# Patient Record
Sex: Male | Born: 1955 | Race: White | Hispanic: No | Marital: Single | Smoking: Former smoker
Health system: Southern US, Community
[De-identification: ages and names within clinical notes are randomized; demographics above are authoritative.]

## PROBLEM LIST (undated history)

## (undated) DIAGNOSIS — E079 Disorder of thyroid, unspecified: Secondary | ICD-10-CM

## (undated) DIAGNOSIS — I10 Essential (primary) hypertension: Secondary | ICD-10-CM

## (undated) DIAGNOSIS — J45909 Unspecified asthma, uncomplicated: Secondary | ICD-10-CM

## (undated) DIAGNOSIS — I219 Acute myocardial infarction, unspecified: Secondary | ICD-10-CM

## (undated) DIAGNOSIS — Z951 Presence of aortocoronary bypass graft: Secondary | ICD-10-CM

## (undated) HISTORY — PX: CORONARY ARTERY BYPASS GRAFT: SHX141

## (undated) HISTORY — PX: HERNIA REPAIR: SHX51

## (undated) HISTORY — DX: Presence of aortocoronary bypass graft: Z95.1

## (undated) HISTORY — PX: KNEE SURGERY: SHX244

## (undated) HISTORY — DX: Acute myocardial infarction, unspecified: I21.9

## (undated) HISTORY — DX: Unspecified asthma, uncomplicated: J45.909

## (undated) HISTORY — PX: GASTRIC BYPASS: SHX52

## (undated) HISTORY — DX: Essential (primary) hypertension: I10

## (undated) HISTORY — PX: CARDIAC SURGERY: SHX584

## (undated) HISTORY — PX: REPLACEMENT TOTAL KNEE BILATERAL: SUR1225

---

## 2015-04-01 ENCOUNTER — Observation Stay: Payer: PRIVATE HEALTH INSURANCE

## 2015-04-01 ENCOUNTER — Emergency Department: Payer: PRIVATE HEALTH INSURANCE

## 2015-04-01 ENCOUNTER — Observation Stay
Admission: EM | Admit: 2015-04-01 | Discharge: 2015-04-02 | Disposition: A | Discharge status: Home / Self Care | Payer: PRIVATE HEALTH INSURANCE | Attending: Internal Medicine | Admitting: Internal Medicine

## 2015-04-01 ENCOUNTER — Observation Stay: Payer: PRIVATE HEALTH INSURANCE | Admitting: Internal Medicine

## 2015-04-01 DIAGNOSIS — I25119 Atherosclerotic heart disease of native coronary artery with unspecified angina pectoris: Secondary | ICD-10-CM | POA: Insufficient documentation

## 2015-04-01 DIAGNOSIS — Z87891 Personal history of nicotine dependence: Secondary | ICD-10-CM | POA: Insufficient documentation

## 2015-04-01 DIAGNOSIS — R799 Abnormal finding of blood chemistry, unspecified: Secondary | ICD-10-CM | POA: Insufficient documentation

## 2015-04-01 DIAGNOSIS — R42 Dizziness and giddiness: Secondary | ICD-10-CM | POA: Insufficient documentation

## 2015-04-01 DIAGNOSIS — Z7982 Long term (current) use of aspirin: Secondary | ICD-10-CM | POA: Insufficient documentation

## 2015-04-01 DIAGNOSIS — R1013 Epigastric pain: Secondary | ICD-10-CM | POA: Insufficient documentation

## 2015-04-01 DIAGNOSIS — R600 Localized edema: Secondary | ICD-10-CM | POA: Insufficient documentation

## 2015-04-01 DIAGNOSIS — J45909 Unspecified asthma, uncomplicated: Secondary | ICD-10-CM | POA: Insufficient documentation

## 2015-04-01 DIAGNOSIS — R0789 Other chest pain: Principal | ICD-10-CM | POA: Insufficient documentation

## 2015-04-01 DIAGNOSIS — R918 Other nonspecific abnormal finding of lung field: Secondary | ICD-10-CM | POA: Insufficient documentation

## 2015-04-01 DIAGNOSIS — N400 Enlarged prostate without lower urinary tract symptoms: Secondary | ICD-10-CM | POA: Insufficient documentation

## 2015-04-01 DIAGNOSIS — R001 Bradycardia, unspecified: Secondary | ICD-10-CM | POA: Insufficient documentation

## 2015-04-01 DIAGNOSIS — E785 Hyperlipidemia, unspecified: Secondary | ICD-10-CM | POA: Insufficient documentation

## 2015-04-01 DIAGNOSIS — K219 Gastro-esophageal reflux disease without esophagitis: Secondary | ICD-10-CM | POA: Insufficient documentation

## 2015-04-01 DIAGNOSIS — R5381 Other malaise: Secondary | ICD-10-CM | POA: Insufficient documentation

## 2015-04-01 DIAGNOSIS — Z9884 Bariatric surgery status: Secondary | ICD-10-CM | POA: Insufficient documentation

## 2015-04-01 DIAGNOSIS — I10 Essential (primary) hypertension: Secondary | ICD-10-CM | POA: Insufficient documentation

## 2015-04-01 DIAGNOSIS — I252 Old myocardial infarction: Secondary | ICD-10-CM | POA: Insufficient documentation

## 2015-04-01 DIAGNOSIS — D649 Anemia, unspecified: Secondary | ICD-10-CM | POA: Insufficient documentation

## 2015-04-01 DIAGNOSIS — Z6841 Body Mass Index (BMI) 40.0 and over, adult: Secondary | ICD-10-CM | POA: Insufficient documentation

## 2015-04-01 DIAGNOSIS — R079 Chest pain, unspecified: Secondary | ICD-10-CM | POA: Insufficient documentation

## 2015-04-01 DIAGNOSIS — Z8249 Family history of ischemic heart disease and other diseases of the circulatory system: Secondary | ICD-10-CM | POA: Insufficient documentation

## 2015-04-01 DIAGNOSIS — I083 Combined rheumatic disorders of mitral, aortic and tricuspid valves: Secondary | ICD-10-CM | POA: Insufficient documentation

## 2015-04-01 DIAGNOSIS — Z951 Presence of aortocoronary bypass graft: Secondary | ICD-10-CM

## 2015-04-01 DIAGNOSIS — I493 Ventricular premature depolarization: Secondary | ICD-10-CM | POA: Insufficient documentation

## 2015-04-01 DIAGNOSIS — I371 Nonrheumatic pulmonary valve insufficiency: Secondary | ICD-10-CM | POA: Insufficient documentation

## 2015-04-01 DIAGNOSIS — I517 Cardiomegaly: Secondary | ICD-10-CM | POA: Insufficient documentation

## 2015-04-01 LAB — CBC AND DIFFERENTIAL
Basophils Absolute Automated: 0.08 10*3/uL (ref 0.00–0.20)
Basophils Automated: 1 %
Eosinophils Absolute Automated: 0.16 10*3/uL (ref 0.00–0.70)
Eosinophils Automated: 2 %
Hematocrit: 34.3 % — ABNORMAL LOW (ref 42.0–52.0)
Hgb: 10.6 g/dL — ABNORMAL LOW (ref 13.0–17.0)
Immature Granulocytes Absolute: 0.01 10*3/uL
Immature Granulocytes: 0 %
Lymphocytes Absolute Automated: 1.38 10*3/uL (ref 0.50–4.40)
Lymphocytes Automated: 18 %
MCH: 25.4 pg — ABNORMAL LOW (ref 28.0–32.0)
MCHC: 30.9 g/dL — ABNORMAL LOW (ref 32.0–36.0)
MCV: 82.1 fL (ref 80.0–100.0)
MPV: 10.6 fL (ref 9.4–12.3)
Monocytes Absolute Automated: 0.66 10*3/uL (ref 0.00–1.20)
Monocytes: 8 %
Neutrophils Absolute: 5.45 10*3/uL (ref 1.80–8.10)
Neutrophils: 70 %
Nucleated RBC: 0 /100 WBC (ref 0–1)
Platelets: 308 10*3/uL (ref 140–400)
RBC: 4.18 10*6/uL — ABNORMAL LOW (ref 4.70–6.00)
RDW: 18 % — ABNORMAL HIGH (ref 12–15)
WBC: 7.74 10*3/uL (ref 3.50–10.80)

## 2015-04-01 LAB — BASIC METABOLIC PANEL
BUN: 23 mg/dL (ref 9.0–28.0)
CO2: 21 mEq/L — ABNORMAL LOW (ref 22–29)
Calcium: 9.2 mg/dL (ref 8.5–10.5)
Chloride: 106 mEq/L (ref 100–111)
Creatinine: 1.5 mg/dL — ABNORMAL HIGH (ref 0.7–1.3)
Glucose: 72 mg/dL (ref 70–100)
Potassium: 4.5 mEq/L (ref 3.5–5.1)
Sodium: 140 mEq/L (ref 136–145)

## 2015-04-01 LAB — I-STAT TROPONIN: i-STAT Troponin: 0 ng/mL (ref 0.00–0.09)

## 2015-04-01 LAB — B-TYPE NATRIURETIC PEPTIDE: B-Natriuretic Peptide: 265 pg/mL — ABNORMAL HIGH (ref 0–100)

## 2015-04-01 LAB — GFR: EGFR: 47.6

## 2015-04-01 MED ORDER — MORPHINE SULFATE 2 MG/ML IJ/IV SOLN (WRAP)
2.0000 mg | Status: DC | PRN
Start: 2015-04-01 — End: 2015-04-03

## 2015-04-01 MED ORDER — BENZONATATE 100 MG PO CAPS
200.0000 mg | ORAL_CAPSULE | Freq: Three times a day (TID) | ORAL | Status: DC | PRN
Start: 2015-04-01 — End: 2015-04-03
  Administered 2015-04-01: 200 mg via ORAL
  Filled 2015-04-01: qty 2

## 2015-04-01 MED ORDER — FUROSEMIDE 40 MG PO TABS
40.0000 mg | ORAL_TABLET | Freq: Two times a day (BID) | ORAL | Status: DC
Start: 2015-04-01 — End: 2015-04-03
  Administered 2015-04-01 – 2015-04-02 (×2): 40 mg via ORAL
  Filled 2015-04-01 (×2): qty 1

## 2015-04-01 MED ORDER — ASPIRIN 81 MG PO TBEC
81.0000 mg | DELAYED_RELEASE_TABLET | Freq: Every day | ORAL | Status: DC
Start: 2015-04-02 — End: 2015-04-03
  Administered 2015-04-02: 81 mg via ORAL
  Filled 2015-04-01: qty 1

## 2015-04-01 MED ORDER — SPIRONOLACTONE 25 MG PO TABS
25.0000 mg | ORAL_TABLET | Freq: Every day | ORAL | Status: DC
Start: 2015-04-02 — End: 2015-04-03
  Administered 2015-04-02: 25 mg via ORAL
  Filled 2015-04-01: qty 1

## 2015-04-01 MED ORDER — AMIODARONE HCL 200 MG PO TABS
200.0000 mg | ORAL_TABLET | Freq: Every day | ORAL | Status: DC
Start: 2015-04-02 — End: 2015-04-03
  Administered 2015-04-02: 200 mg via ORAL
  Filled 2015-04-01: qty 1

## 2015-04-01 MED ORDER — NITROGLYCERIN 0.4 MG SL SUBL
0.4000 mg | SUBLINGUAL_TABLET | SUBLINGUAL | Status: DC | PRN
Start: 2015-04-01 — End: 2015-04-03

## 2015-04-01 MED ORDER — HEPARIN SODIUM (PORCINE) 5000 UNIT/ML IJ SOLN
5000.0000 [IU] | Freq: Two times a day (BID) | INTRAMUSCULAR | Status: DC
Start: 2015-04-01 — End: 2015-04-03
  Administered 2015-04-01 – 2015-04-02 (×2): 5000 [IU] via SUBCUTANEOUS
  Filled 2015-04-01 (×2): qty 1

## 2015-04-01 MED ORDER — POTASSIUM CHLORIDE CRYS ER 20 MEQ PO TBCR
20.0000 meq | EXTENDED_RELEASE_TABLET | Freq: Every day | ORAL | Status: DC
Start: 2015-04-02 — End: 2015-04-03
  Administered 2015-04-02: 20 meq via ORAL
  Filled 2015-04-01: qty 1

## 2015-04-01 MED ORDER — LISINOPRIL 5 MG PO TABS
2.5000 mg | ORAL_TABLET | Freq: Every day | ORAL | Status: DC
Start: 2015-04-02 — End: 2015-04-03
  Administered 2015-04-02: 2.5 mg via ORAL
  Filled 2015-04-01: qty 1

## 2015-04-01 MED ORDER — ONDANSETRON HCL 4 MG/2ML IJ SOLN
4.0000 mg | Freq: Three times a day (TID) | INTRAMUSCULAR | Status: DC | PRN
Start: 2015-04-01 — End: 2015-04-03

## 2015-04-01 MED ORDER — POTASSIUM CHLORIDE CRYS ER 20 MEQ PO TBCR
40.0000 meq | EXTENDED_RELEASE_TABLET | Freq: Every day | ORAL | Status: DC
Start: 2015-04-01 — End: 2015-04-01

## 2015-04-01 MED ORDER — METOPROLOL SUCCINATE ER 50 MG PO TB24
25.0000 mg | ORAL_TABLET | Freq: Every day | ORAL | Status: DC
Start: 2015-04-02 — End: 2015-04-03
  Administered 2015-04-02: 11:00:00 25 mg via ORAL
  Filled 2015-04-01: qty 1

## 2015-04-01 MED ORDER — ATORVASTATIN CALCIUM 40 MG PO TABS
40.0000 mg | ORAL_TABLET | Freq: Every day | ORAL | Status: DC
Start: 2015-04-02 — End: 2015-04-03
  Administered 2015-04-02: 40 mg via ORAL
  Filled 2015-04-01: qty 1

## 2015-04-01 MED ORDER — ACETAMINOPHEN 650 MG RE SUPP
650.0000 mg | RECTAL | Status: DC | PRN
Start: 2015-04-01 — End: 2015-04-03

## 2015-04-01 MED ORDER — ACETAMINOPHEN 325 MG PO TABS
650.0000 mg | ORAL_TABLET | ORAL | Status: DC | PRN
Start: 2015-04-01 — End: 2015-04-03

## 2015-04-01 MED ORDER — ONDANSETRON 4 MG PO TBDP
4.0000 mg | ORAL_TABLET | Freq: Three times a day (TID) | ORAL | Status: DC | PRN
Start: 2015-04-01 — End: 2015-04-03

## 2015-04-01 MED ORDER — TAMSULOSIN HCL 0.4 MG PO CAPS
0.4000 mg | ORAL_CAPSULE | Freq: Every day | ORAL | Status: DC
Start: 2015-04-01 — End: 2015-04-03
  Administered 2015-04-01 – 2015-04-02 (×2): 0.4 mg via ORAL
  Filled 2015-04-01 (×2): qty 1

## 2015-04-01 MED ORDER — NALOXONE HCL 0.4 MG/ML IJ SOLN
0.2000 mg | INTRAMUSCULAR | Status: DC | PRN
Start: 2015-04-01 — End: 2015-04-03

## 2015-04-01 MED ORDER — TRAZODONE HCL 50 MG PO TABS
50.0000 mg | ORAL_TABLET | Freq: Every evening | ORAL | Status: DC | PRN
Start: 2015-04-01 — End: 2015-04-03
  Administered 2015-04-01: 50 mg via ORAL
  Filled 2015-04-01: qty 1

## 2015-04-01 MED ORDER — ALBUTEROL-IPRATROPIUM 2.5-0.5 (3) MG/3ML IN SOLN
3.0000 mL | RESPIRATORY_TRACT | Status: DC
Start: 2015-04-01 — End: 2015-04-03
  Administered 2015-04-02 (×2): 3 mL via RESPIRATORY_TRACT
  Filled 2015-04-01 (×3): qty 3

## 2015-04-01 MED ORDER — THIAMINE HCL 100 MG PO TABS
100.0000 mg | ORAL_TABLET | Freq: Every day | ORAL | Status: DC
Start: 2015-04-02 — End: 2015-04-03
  Administered 2015-04-02: 100 mg via ORAL
  Filled 2015-04-01: qty 1

## 2015-04-01 NOTE — ED Notes (Signed)
I have briefly evaluated this patient as triage physician in order to facilitate and initiate the ordering of laboratory and imaging studies as needed.      Alcide Evener, MD  04/01/15 [phone]

## 2015-04-01 NOTE — ED Notes (Signed)
Attempt x1 to call report, RN unavailable for report

## 2015-04-01 NOTE — Plan of Care (Signed)
Problem: Chest Pain  Goal: Vital signs and cardiac rhythm stable  Outcome: Progressing  Intervention: Monitor/assess vital signs/cardiac rhythm.  NSR with some PVC's                Goal: Pain at/below Pt Goal: Patient comfortable  Outcome: Progressing  No c/o pain.       Goal: Anxiety managed/Pt demonstrates effective coping  Outcome: Progressing  Goal: Patient/Patient Care Companion demonstrates understanding of disease process, treatment plan, medications, and discharge plan  Outcome: Progressing

## 2015-04-01 NOTE — Progress Notes (Signed)
Patient was evaluated.  Discussed with nurse practitioner.  Detail note to follow.  Assessment  Atypical chest pain.-Suspected angina equivalence  History of coronary artery bypass graft-3 months ago.  AKI or AKI on ckd or ckd   History of cardiac arrhythmia  Hypertension  Hyperlipidemia.  Morbid obesity.  Ex-smoker  Anemia - likely chronic  Benign prostatic hypertrophy  Plan  Patient to cardiac telemetry/observation.  Serial cardiac enzymes.  Thyroid-stimulating hormone, lipid profile, hemoglobin A1c, ECHO.  Closely monitor creatinine.  Strict intake and output.  Daily weight.  BNP.  Cardiology consult.  Keep nothing by mouth after midnight for possible nuclear stress test in a.m.  If bnp is elevated change Lasix to IV.   Resume aspirin, beta blocker, statins, amiodarone and Lasix.  Hold ACE inhibitors for now. Obtain recent hospitalization medical records in a.m. Scheduled albuterol nebulizer treatment.  Sleep studies as outpatient.  Diet, exercise and weight reduction were reinforced.

## 2015-04-01 NOTE — ED Provider Notes (Signed)
Physician/Midlevel provider first contact with patient: 04/01/15 1703         History     Chief Complaint   Patient presents with   . Chest Pain     HPI   59 yo M w/ stated h/o HTN, CAD s/p MI + CABG 3 month ago who presents w/ chest pain.  Patient states he is from [place], here in [place] for work.  States generalized malaise since surgery.  States this afternoon around 1pm noted onset of "indigestion" / epigastric/L sided chest pain.  States pain similar to discomfort from prior MI.  Denies worsening LH or SOB.  Denies cough or fevers.  Denies radiation to jaw, or L shoulder/arm.  Pain 6/10.        Past Medical History   Diagnosis Date   . S/P quadruple vessel bypass    . MI (myocardial infarction)    . Asthma    . Hypertension        Past Surgical History   Procedure Laterality Date   . Coronary artery bypass graft     . Gastric bypass     . Hernia repair     . Knee surgery Bilateral        History reviewed. No pertinent family history.    Social  Social History   Substance Use Topics   . Smoking status: Former Smoker     Quit date: 01/30/2015   . Smokeless tobacco: None   . Alcohol Use: 12.6 oz/week     21 Shots of liquor per week       .     Allergies   Allergen Reactions   . Penicillins Rash       Home Medications                   amiodarone (PACERONE) 200 MG tablet     Take 200 mg by mouth daily.     aspirin EC 81 MG EC tablet     Take 81 mg by mouth daily.     atorvastatin (LIPITOR) 40 MG tablet     Take 40 mg by mouth daily.     benzonatate (TESSALON) 200 MG capsule     Take 200 mg by mouth 3 (three) times daily as needed for Cough.     furosemide (LASIX) 40 MG tablet     Take 40 mg by mouth 2 (two) times daily.     lisinopril (PRINIVIL,ZESTRIL) 2.5 MG tablet     Take 2.5 mg by mouth daily.     metoprolol XL (TOPROL-XL) 25 MG 24 hr tablet     Take 25 mg by mouth daily.     POTASSIUM PO     Take by mouth.     spironolactone (ALDACTONE) 25 MG tablet     Take 25 mg by mouth daily.     tamsulosin (FLOMAX) 0.4 MG  Cap     Take 0.4 mg by mouth Daily after dinner.     vitamin B-1 (THIAMINE) 100 MG tablet     Take 100 mg by mouth daily.           Review of Systems   Constitutional: Negative for fever.   Respiratory: Positive for chest tightness. Negative for shortness of breath.    Cardiovascular: Positive for chest pain.   All other systems reviewed and are negative.      Physical Exam    BP: 142/65 mmHg, Heart Rate: 74, Temp: 97 F (36.1  C), Resp Rate: (!) 28, SpO2: 95 %    Physical Exam   Constitutional: He is oriented to person, place, and time. He appears distressed.   Slightly disheveled  Slightly anxious   HENT:   Head: Normocephalic and atraumatic.   Mouth/Throat: Oropharynx is clear and moist.   Poor dentition   Eyes: Conjunctivae and EOM are normal. Pupils are equal, round, and reactive to light.   Neck: Normal range of motion. Neck supple. No tracheal deviation present.   Cardiovascular: Normal rate, regular rhythm and normal heart sounds.    Pulmonary/Chest: Effort normal and breath sounds normal. No respiratory distress. He has no wheezes.   Abdominal: Soft. He exhibits no distension. There is no tenderness.   Musculoskeletal: Normal range of motion. He exhibits no edema or tenderness.   Neurological: He is alert and oriented to person, place, and time. Coordination normal.   Skin: Skin is warm and dry. He is not diaphoretic. No erythema.   Psychiatric:   anxious   Nursing note and vitals reviewed.        MDM and ED Course     6:37 PM Spoke with Dr. Sung Amabile. Will admit    ED Medication Orders     None             MDM  Number of Diagnoses or Management Options  Chest pain, unspecified chest pain type:   Diagnosis management comments: Medical Decision Making      Presumptive Diagnosis: chest pain NOS    Treatment Plan: being evaluated for possible admission to Medicine    I reviewed the vital signs, nursing notes, past medical history, past surgical history, family history and social history.  Olevia Bowens,  MD    Vital Signs - BP 142/65 mmHg  Pulse 74  Temp(Src) 97 F (36.1 C) (Temporal Artery)  Resp 28  SpO2 95%    Pulse Oximetry Analysis -  Normal    Differential Diagnosis (not completely inclusive): ACS, PE, GERD, musculoskeletal pain    Laboratory results reviewed by EDP: Yes  Radiologic study results reviewed by EDP: Yes  Radiologic Studies Interpreted (viewed) by EDP: Yes                Procedures    Clinical Impression & Disposition     Clinical Impression  Final diagnoses:   Chest pain, unspecified chest pain type        ED Disposition     Observation Admitting Physician: Leroy Libman [[id number]]  Diagnosis: Chest pain [1610960]  Estimated Length of Stay: < 2 midnights  Tentative Discharge Plan?: Home or Self Care [1]  Patient Class: Observation [104]             New Prescriptions    No medications on file      EKG: #1  Normal sinus 71. No ST segment changes. Low amplitude. Impression: normal EKG    EKG: #2  No acute changes      I was acting as a scribe for Olevia Bowens, MD on Reva Bores B  Treatment Team: Scribe: Bennett Scrape     I am the first provider for this patient and I personally performed the services documented. Treatment Team: Scribe: Bennett Scrape is scribing for me on Tunnell,Jahaziel B. This note and the patient instructions accurately reflect work and decisions made by me.  Olevia Bowens, MD        Noted initial high concern for ACS.  Serial EKG negative initial troponin  at 5 hours 0.00.  Pain improved at 1845.  "It doesn't hurt right now, it only hurts a little bit when I move."  Considered PE however, no calf pain.  Not tachycardic.  O2sat 98 % on RA.   Denies current pleuritic component.  Will continue to monitor for signs concerning for PE.  But now would be unable to complete CT chest due to size and creatinine    Olevia Bowens, MD  04/01/15 [phone]

## 2015-04-01 NOTE — H&P (Signed)
HISTORY & PHYSICAL     Date Time: 04/01/2015 11:48 PM  Patient Name: Justin Hernandez, Justin Hernandez  DOB: [birth year]  MRN: [id number]  ADULT OBSERVATION UNIT V409/W119.14  Attending Physician: Roe Coombs  Primary Care Physician: No primary care provider on file.    CHIEF COMPLAINT:  I had chest pain    ASSESSMENT:   59 year old white, morbidly obese hyperlipidemic, hypertensive male with a 42-pack-year history of tobacco abuse who, in the last 3 months, sustained an MI and underwent coronary artery bypass graft, and who presents to the emergency department after experiencing nonexertional and non-radiating left inframammary chest pain      Active Hospital Problems    Diagnosis   . Chest pain          PLAN:   1. Chest pain   -telemetry monitoring  -NTG PRN   -serial troponin  -O2 NC protocol   -risk stratify for the following: hyperlipidemia, hypertension, diabetes, obesity, +family hx of CAD  -IMG [facility] to see in the a.m. (message left on late-night line)  -Morphine 2 mg IV every 2 hours hours when necessary for cardiac-related pain  -Nothing by mouth after midnight in preparation for cardiac consult in the a.m.    2. HTN  -Continue home medications; hold a.m., metoprolol dose for potential nuclear stress testing    3.  Peripheral edema  -Continue furosemide  -Continue potassium chloride  -Continue spironolactone    4.  Hyperlipidemia  -Continue statin    5.  Continue all other home medications                                                                                        Please see attending MD's internal medicine note that follows this NP's encounter note    Disposition:   Today's date: 04/01/2015  Admit Date: 04/01/2015  5:08 PM  Anticipated medical stability for discharge:Yellow - maybe tomorrow  Service status: Observation: Due to CP   Reason for ongoing hospitalization: r/o ACS  Anticipated discharge needs: to home     History of Presenting Illness:   Justin Hernandez is a 59 year old white,  morbidly obese hyperlipidemic, hypertensive male with a 42-pack-year history of tobacco abuse who, in the last 3 months, sustained an MI and underwent coronary artery bypass graft in his home state of [place], and who presents to the emergency department after experiencing nonexertional and non-radiating left inframammary chest pain that closely resembled the chest pain he experienced 3 months ago when he had his MI.  During this episode, he denies nausea, vomiting, diaphoresis, palpitations, syncope, orthopnea, but reports that he does have chronic peripheral edema and is on diuretic therapy     Patient's cardiac risk factors are advanced age (older than 88 for men, 88 for women), dyslipidemia, hypertension, male gender, obesity (BMI >= 30 kg/m2), sedentary lifestyle and smoking/ tobacco exposure.    Patient's risk factors for DVT/PE: none.    Previous cardiac testing includes: chest x-ray, electrocardiogram (ECG), HDL, LDL and thyroid function.  Patient Active Problem List   Diagnosis   . Chest pain       Past Medical History:  Past Medical History   Diagnosis Date   . S/P quadruple vessel bypass    . MI (myocardial infarction)    . Asthma    . Hypertension        Past Surgical History:     Past Surgical History   Procedure Laterality Date   . Coronary artery bypass graft     . Gastric bypass     . Hernia repair     . Knee surgery Bilateral        Family History:   History reviewed. No pertinent family history.    Social History:     Social History     Social History   . Marital Status: Divorced     Spouse Name: N/A   . Number of Children: N/A   . Years of Education: N/A     Social History Main Topics   . Smoking status: Former Smoker     Quit date: 01/30/2015   . Smokeless tobacco: Not on file   . Alcohol Use: 12.6 oz/week     21 Shots of liquor per week   . Drug Use: No   . Sexual Activity: Not on file     Other Topics Concern   . Not on file     Social History Narrative   . No narrative on file        Allergies:     Allergies   Allergen Reactions   . Penicillins Rash       Medications:     Prior to Admission medications    Medication Sig Start Date End Date Taking? Authorizing Provider   amiodarone (PACERONE) 200 MG tablet Take 200 mg by mouth daily.   Yes [provider]   aspirin EC 81 MG EC tablet Take 81 mg by mouth daily.   Yes [provider]   atorvastatin (LIPITOR) 40 MG tablet Take 40 mg by mouth daily.   Yes [provider]   furosemide (LASIX) 40 MG tablet Take 40 mg by mouth 2 (two) times daily.   Yes [provider]   lisinopril (PRINIVIL,ZESTRIL) 2.5 MG tablet Take 2.5 mg by mouth daily.   Yes [provider]   metoprolol XL (TOPROL-XL) 25 MG 24 hr tablet Take 25 mg by mouth daily.   Yes [provider]   POTASSIUM PO Take by mouth.   Yes [provider]   spironolactone (ALDACTONE) 25 MG tablet Take 25 mg by mouth daily.   Yes [provider]   tamsulosin (FLOMAX) 0.4 MG Cap Take 0.4 mg by mouth Daily after dinner.   Yes [provider]   benzonatate (TESSALON) 200 MG capsule Take 200 mg by mouth 3 (three) times daily as needed for Cough.    [provider]   vitamin B-1 (THIAMINE) 100 MG tablet Take 100 mg by mouth daily.    [provider]       Review of Systems:   Pertinent items are noted in HPI.  All other systems were reviewed and negative    Physical Exam:   BP 146/77 mmHg  Pulse 73  Temp(Src) 97.1 F (36.2 C) (Axillary)  Resp 22  Ht 1.778 m (5\' 10" )  Wt 158.759 kg (350 lb)  BMI 50.22 kg/m2  SpO2 98%     Constitutional: alert, oriented x 3; no acute distress  Head/eyes/ears/neck/throat: Pupils are equal, round, reactive to light, BILAT; sclera anicteric; oropharynx clear without lesions, mucous membranes moist; neck  supple, no lymphadenopathy, no thyromegaly, no JVD, no carotid bruits   Cardiovascular: Heart rate and rhythm regular, no murmurs, rubs or gallops. Taut, and red LE,  B/L with generalized peripheral edema. Nail beds are pink, cool to touch; capillary refill +3 secs  Pulmonary: LEFT basilar crackles; otherwise, diminished. Congested cough  Gastrointestinal: rotund; normoactive BS; tympanic soft, non-tender, non-distended; no palpable masses, no rebound or guarding  Neurological: No focal neurological deficits noted; face symmetric; reflexes intact and symmetric, BILAT; appropriate blink reflex   Skin: intact, no rashes, lesions or ulcerations; skin turgor +2 secs    Labs/Imaging:   I have Personally Reviewed   Results     Procedure Component Value Units Date/Time    Troponin I [[id number]] Collected:  04/01/15 2333    Specimen Information:  Blood Updated:  04/01/15 2347    TSH [[id number]] Collected:  04/01/15 2333    Specimen Information:  Blood Updated:  04/01/15 2347    Hemoglobin A1C [[id number]] Collected:  04/01/15 2333    Specimen Information:  Blood Updated:  04/01/15 2333    B-type Natriuretic Peptide [[id number]]  (Abnormal) Collected:  04/01/15 1728    Specimen Information:  Blood Updated:  04/01/15 2054     B-Natriuretic Peptide 265 (H) pg/mL     i-Stat Troponin [[id number]] Collected:  04/01/15 1728     i-STAT Troponin 0.00 ng/mL Updated:  04/01/15 1752    Basic Metabolic Panel [[id number]]  (Abnormal) Collected:  04/01/15 1728    Specimen Information:  Blood Updated:  04/01/15 1751     Glucose 72 mg/dL      BUN 53.6 mg/dL      Creatinine 1.5 (H) mg/dL      Calcium 9.2 mg/dL      Sodium 644 mEq/L      Potassium 4.5 mEq/L      Chloride 106 mEq/L      CO2 21 (L) mEq/L     GFR [[id number]] Collected:  04/01/15 1728     EGFR 47.6 Updated:  04/01/15 1751    CBC with differential [[id number]]  (Abnormal) Collected:  04/01/15 1728    Specimen Information:  Blood from Blood Updated:  04/01/15 1750     WBC 7.74 x10 3/uL      Hgb 10.6 (L) g/dL      Hematocrit 29.5 (L) %      Platelets 308 x10 3/uL      RBC 4.18 (L) x10 6/uL      MCV 82.1 fL      MCH 25.4 (L) pg      MCHC 30.9 (L) g/dL       RDW 18 (H) %      MPV 10.6 fL      Neutrophils 70 %      Lymphocytes Automated 18 %      Monocytes 8 %      Eosinophils Automated 2 %      Basophils Automated 1 %      Immature Granulocyte 0 %      Nucleated RBC 0 /100 WBC      Neutrophils Absolute 5.45 x10 3/uL      Abs Lymph Automated 1.38 x10 3/uL      Abs Mono Automated 0.66 x10 3/uL      Abs Eos Automated 0.16 x10 3/uL      Absolute Baso Automated 0.08 x10 3/uL      Absolute Immature Granulocyte 0.01 x10 3/uL  ECG: Sinus rhythm with no significant ST/T-wave.   Radiology:   Radiology Results (24 Hour)     Procedure Component Value Units Date/Time    XR Chest 2 Views [[id number]] Collected:  04/01/15 1941    Order Status:  Completed Updated:  04/01/15 2130    Narrative:      CLINICAL INFORMATION: Chest pain. Follow-up to single view study  dictated previously.    Technique and findings: Chest: PA and lateral. Comparison with portable  chests performed earlier.    Better inspiration. Minimal linear scarring at the left lung base with  minimal left pleural thickening. No pneumonia, significant atelectasis,  mass, pleural effusion, or pneumothorax. Mild cardiomegaly. Pulmonary  vasculature does not appear congested on this better inspiratory film  with no findings suggesting ongoing CHF.      Impression:        1. Prior median sternotomy. Mild cardiomegaly. No CHF.  2. No acute pulmonary or pleural process.    Annabell Sabal, MD   04/01/2015 9:26 PM      XR Chest  AP Portable [[id number]] Collected:  04/01/15 1801    Order Status:  Completed Updated:  04/01/15 1807    Narrative:      CLINICAL INFORMATION: 59 year old. Chest pain. History of coronary  artery surgery.    Technique and findings: Semiupright portable chest at 1729 hours. No  comparisons.    Status post median sternotomy. Shallow inspiration. Blunting of the left  lateral costophrenic angle likely due to cardiomegaly and possible  postoperative pleural thickening. No convincing evidence of  pneumonia.  Consider two-view study for better assessment. No definite pleural  effusion or pneumothorax. Moderate cardiomegaly accentuated by shallow  inspiration. Prominent upper lobe vessels consistent with vascular  congestion. No pulmonary edema.      Impression:        1. Moderate cardiomegaly with vascular congestion. No pulmonary edema.  2. Opacity in the left lower lateral chest likely due to cardiac  enlargement and pleural thickening. Consider two-view study for better  assessment. Lungs and pleural space otherwise clear.    Annabell Sabal, MD   04/01/2015 6:03 PM            I have discussed with Attending: L. Nachimuthu, MD     Berenda Morale. Clovis Riley, MSN, [facility], APRN  Acute Care Nurse Practitioner   Medical Observation Unit   [phone] or [phone]

## 2015-04-01 NOTE — ED Notes (Addendum)
Attempt x 2 to call report, no answer

## 2015-04-02 ENCOUNTER — Observation Stay: Payer: PRIVATE HEALTH INSURANCE

## 2015-04-02 DIAGNOSIS — R079 Chest pain, unspecified: Secondary | ICD-10-CM | POA: Insufficient documentation

## 2015-04-02 DIAGNOSIS — I25119 Atherosclerotic heart disease of native coronary artery with unspecified angina pectoris: Secondary | ICD-10-CM

## 2015-04-02 DIAGNOSIS — Z951 Presence of aortocoronary bypass graft: Secondary | ICD-10-CM

## 2015-04-02 LAB — CBC AND DIFFERENTIAL
Basophils Absolute Automated: 0.08 10*3/uL (ref 0.00–0.20)
Basophils Automated: 1 %
Eosinophils Absolute Automated: 0.15 10*3/uL (ref 0.00–0.70)
Eosinophils Automated: 2 %
Hematocrit: 33.4 % — ABNORMAL LOW (ref 42.0–52.0)
Hgb: 10 g/dL — ABNORMAL LOW (ref 13.0–17.0)
Immature Granulocytes Absolute: 0.01 10*3/uL
Immature Granulocytes: 0 %
Lymphocytes Absolute Automated: 1.45 10*3/uL (ref 0.50–4.40)
Lymphocytes Automated: 19 %
MCH: 24.6 pg — ABNORMAL LOW (ref 28.0–32.0)
MCHC: 29.9 g/dL — ABNORMAL LOW (ref 32.0–36.0)
MCV: 82.3 fL (ref 80.0–100.0)
MPV: 10.4 fL (ref 9.4–12.3)
Monocytes Absolute Automated: 0.95 10*3/uL (ref 0.00–1.20)
Monocytes: 12 %
Neutrophils Absolute: 5.11 10*3/uL (ref 1.80–8.10)
Neutrophils: 66 %
Nucleated RBC: 0 /100 WBC (ref 0–1)
Platelets: 285 10*3/uL (ref 140–400)
RBC: 4.06 10*6/uL — ABNORMAL LOW (ref 4.70–6.00)
RDW: 19 % — ABNORMAL HIGH (ref 12–15)
WBC: 7.75 10*3/uL (ref 3.50–10.80)

## 2015-04-02 LAB — LIPID PANEL
Cholesterol / HDL Ratio: 1.9
Cholesterol: 115 mg/dL (ref 0–199)
HDL: 60 mg/dL (ref 40–9999)
LDL Calculated: 44 mg/dL (ref 0–99)
Triglycerides: 57 mg/dL (ref 34–149)
VLDL Calculated: 11 mg/dL (ref 10–40)

## 2015-04-02 LAB — ECG 12-LEAD
Atrial Rate: 71 {beats}/min
Atrial Rate: 69 {beats}/min
Atrial Rate: 64 {beats}/min
P Axis: 37 degrees
P Axis: 36 degrees
P Axis: 32 degrees
P-R Interval: 170 ms
P-R Interval: 172 ms
P-R Interval: 150 ms
Q-T Interval: 396 ms
Q-T Interval: 406 ms
Q-T Interval: 430 ms
QRS Duration: 74 ms
QRS Duration: 76 ms
QRS Duration: 90 ms
QTC Calculation (Bezet): 430 ms
QTC Calculation (Bezet): 435 ms
QTC Calculation (Bezet): 443 ms
R Axis: 48 degrees
R Axis: 50 degrees
R Axis: 40 degrees
T Axis: 49 degrees
T Axis: 65 degrees
T Axis: 18 degrees
Ventricular Rate: 71 {beats}/min
Ventricular Rate: 69 {beats}/min
Ventricular Rate: 64 {beats}/min

## 2015-04-02 LAB — HEMOGLOBIN A1C: Hemoglobin A1C: 5.8 % (ref 4.6–5.9)

## 2015-04-02 LAB — BASIC METABOLIC PANEL
BUN: 23 mg/dL (ref 9.0–28.0)
CO2: 22 mEq/L (ref 22–29)
Calcium: 8.7 mg/dL (ref 8.5–10.5)
Chloride: 107 mEq/L (ref 100–111)
Creatinine: 1.6 mg/dL — ABNORMAL HIGH (ref 0.7–1.3)
Glucose: 86 mg/dL (ref 70–100)
Potassium: 4.8 mEq/L (ref 3.5–5.1)
Sodium: 140 mEq/L (ref 136–145)

## 2015-04-02 LAB — TSH: TSH: 13.98 u[IU]/mL — ABNORMAL HIGH (ref 0.35–4.94)

## 2015-04-02 LAB — TROPONIN I
Troponin I: <0.01 ng/mL (ref 0.00–0.09)
Troponin I: <0.01 ng/mL (ref 0.00–0.09)

## 2015-04-02 LAB — T3: T3: 0.54 ng/mL (ref 0.48–1.59)

## 2015-04-02 LAB — T4, FREE: T4 Free: 0.85 ng/dL (ref 0.70–1.48)

## 2015-04-02 LAB — GFR: EGFR: 44.2

## 2015-04-02 LAB — HEMOLYSIS INDEX: Hemolysis Index: 25 — ABNORMAL HIGH (ref 0–18)

## 2015-04-02 MED ORDER — REGADENOSON 0.4 MG/5ML IV SOLN
INTRAVENOUS | Status: AC
Start: 2015-04-02 — End: ?
  Filled 2015-04-02: qty 5

## 2015-04-02 MED ORDER — PERFLUTREN LIPID MICROSPHERE 6.52 MG/ML IV SUSP
2.0000 mL | Freq: Once | INTRAVENOUS | Status: AC
Start: 2015-04-02 — End: 2015-04-02
  Administered 2015-04-02: 2 mL via INTRAVENOUS

## 2015-04-02 MED ORDER — TECHNETIUM TC 99M TETROFOSMIN INJECTION
1.0000 | Freq: Once | Status: AC
Start: 2015-04-02 — End: 2015-04-02
  Administered 2015-04-02: 1 via INTRAVENOUS
  Filled 2015-04-02: qty 1

## 2015-04-02 MED ORDER — OMEPRAZOLE 20 MG PO CPDR
20.0000 mg | DELAYED_RELEASE_CAPSULE | Freq: Every day | ORAL | Status: AC
Start: 2015-04-02 — End: ?

## 2015-04-02 MED ORDER — REGADENOSON 0.4 MG/5ML IV SOLN
0.4000 mg | Freq: Once | INTRAVENOUS | Status: AC | PRN
Start: 2015-04-02 — End: 2015-04-02
  Administered 2015-04-02: 0.4 mg via INTRAVENOUS
  Filled 2015-04-02: qty 5

## 2015-04-02 NOTE — Progress Notes (Addendum)
I personally supervised the lexiscan nuclear stress test, results pending.

## 2015-04-02 NOTE — Consults (Addendum)
[facility]    Date Time: 04/02/2015 9:27 AM  Patient Name: Justin Hernandez  Requesting Physician: Roe Coombs    Reason for Consultation:   Chest pain    History:   SUHAYB ANZALONE is a 59 y.o. male with a history of hypertension, hyperlipidemia, coronary artery disease, history of prior MI and status post CABG  A few weeks ago with probable successful LAD bypass but residual severe TVD (operation was stopped early due to problems), probable cardiomyopathy with 100 ft dyspnea on exertion since recent MI and CABG who presented to the hospital on 04/01/2015 with left sternal border burning similar to his MI but less severe and also similar to prior GERD. He was not given any meds for relief and the pain resolved on its own. He has morbid obesity and untreated sleep apnea. He had previous gastric bypass and used to weigh 450 lbs. He is a  Former smoker and has 2 drinks a night. No orthopnea or pnd. No syncope, near syncope, palpitations. He feels light headed with standing. No other chest pain.   Past Medical History:     Past Medical History   Diagnosis Date   . S/P quadruple vessel bypass    . MI (myocardial infarction)    . Asthma    . Hypertension       Past Surgical History   Procedure Laterality Date   . Coronary artery bypass graft     . Gastric bypass     . Hernia repair     . Knee surgery Bilateral       Family History:   History reviewed. No pertinent family history. positive for premature CAD  Social History:     Social History   Substance Use Topics   . Smoking status: Former Smoker     Quit date: 01/30/2015   . Smokeless tobacco: Not on file   . Alcohol Use: 12.6 oz/week     21 Shots of liquor per week      Allergies:   He is allergic to penicillins.  Medications:   Home Meds:  Prior to Admission medications    Medication Sig Start Date End Date Taking? Authorizing Provider   amiodarone (PACERONE) 200 MG tablet Take 200 mg by mh daily.   Yes [provider]   aspirin EC 81 MG EC tablet Take 81 mg by mh daily.   Yes [provider]   atorvastatin (LIPITOR) 40 MG tablet Take 40 mg by mh daily.   Yes [provider]   furosemide (LASIX) 40 MG tablet Take 40 mg by mh 2 (two) times daily.   Yes [provider]   lisinopril (PRINIVIL,ZESTRIL) 2.5 MG tablet Take 2.5 mg by mh daily.   Yes [provider]   metoprolol XL (TOPROL-XL) 25 MG 24 hr tablet Take 25 mg by mh daily.   Yes [provider]   POTASSIUM PO Take by mh.   Yes [provider]   spironolactone (ALDACTONE) 25 MG tablet Take 25 mg by mh daily.   Yes [provider]   tamsulosin (FLOMAX) 0.4 MG Cap Take 0.4 mg by mh Daily after dinner.   Yes [provider]   benzonatate (TESSALON) 200 MG capsule Take 200 mg by mh 3 (three) times daily as needed for Cough.    [provider]   vitamin B-1 (THIAMINE) 100 MG tablet Take 100 mg by mh daily.    [provider]      Scheduled Meds:  albuterol-ipratropium 3 mL Nebulization Q4H SCH   amiodarone 200 mg Oral Daily   aspirin EC 81 mg Oral Daily   atorvastatin 40 mg Oral Daily   furosemide 40 mg Oral BID   heparin (porcine) 5,000 Units Subcutaneous Q12H SCH   lisinopril 2.5 mg Oral Daily   metoprolol XL 25 mg Oral Daily   potassium chloride 20 mEq Oral Daily   spironolactone 25 mg Oral Daily   tamsulosin 0.4 mg Oral QD after dinner   thiamine 100 mg Oral Daily     Continuous Infusions:   PRN Meds:acetaminophen **OR** acetaminophen, benzonatate, morphine, naloxone, nitroglycerin, ondansetron **OR** ondansetron, traZODone  Review of Systems:   All other systems were reviewed and were negative except as noted in the HPI.  Physical Exam:   Vital Signs: BP 124/58 mmHg  Pulse 73  Temp(Src) 96.7 F (35.9 C) (Oral)  Resp 18  Ht 1.778 m (5\' 10" )  Wt 158.759 kg (350 lb)  BMI 50.22 kg/m2  SpO2 97%   Intake/Output Summary (Last 24 hours) at 04/02/15 1610  Last data filed at  04/02/15 0514   Gross per 24 hour   Intake    360 ml   Output   1350 ml   Net   -990 ml     General Appearance:  Alert, well-appearing male in no acute distress.    HEENT: Sclera anicteric, conjunctiva with pallor, moist mucous membranes, normal dentition. No arcus.   Neck:  Supple with jugular venous distention. Thyroid nonpalpable. Normal carotid upstroke with bruits.   Chest: Clear to auscultation bilaterally with good air movement and respiratory effort and no wheezes, rales, or rhonchi   Cardiovascular: Normal S1 and physiologically split S2 with murmurs, gallops or rub. PMI of normal size and nondisplaced.   Abdomen: Soft, nontender, nondistended, with normoactive bowel sounds. No organomegaly.  No pulsatile masses, or bruits.   Extremities: Warm with edema, clubbing, or cyanosis. All peripheral pulses are full and equal.   Skin: Normal coloration and turgor, no rash, xanthoma or xanthelasma.   Musculoskeletal: no joint tenderness, deformity or swelling  Neuro: Alert and oriented x3. Grossly intact. Strength is symmetrical. Normal mood and affect.   Cardiographics:   ECG: 8/29 NSR consider anterior wall MI age indeterminate  Telemetry: NSR    Labs Reviewed:     Recent Labs      04/02/15   0511  04/01/15   1728   WBC  7.75  7.74   HGB  10.0*  10.6*   HEMATOCRIT  33.4*  34.3*   PLATELETS  285  308     Recent Labs      04/02/15   0511  04/01/15   2333  04/01/15   1728   TROPONIN I  <0.01  <0.01   --    I-STAT TROPONIN   --    --   0.00   B-NATRIURETIC PEPTIDE   --    --   265*    Recent Labs      04/02/15   0511  04/01/15   1728   SODIUM  140  140   POTASSIUM  4.8  4.5   CO2  22  21*   BUN  23.0  23.0   CREATININE  1.6*  1.5*   GLUCOSE  86  72     Recent Labs      04/02/15   0511  04/01/15   2333  THYROID STIMULATING HORMONE   --   13.98*   CHOLESTEROL  115   --    TRIGLYCERIDES  57   --    HDL  60   --    LDL CALCULATED  44   --         Rads:     Radiology Results (24 Hour)     Procedure Component Value Units  Date/Time    XR Chest 2 Views [[id number]] Collected:  04/01/15 1941    Order Status:  Completed Updated:  04/01/15 2130    Narrative:      CLINICAL INFORMATION: Chest pain. Follow-up to single view study  dictated previously.    Technique and findings: Chest: PA and lateral. Comparison with portable  chests performed earlier.    Better inspiration. Minimal linear scarring at the left lung base with  minimal left pleural thickening. No pneumonia, significant atelectasis,  mass, pleural effusion, or pneumothorax. Mild cardiomegaly. Pulmonary  vasculature does not appear congested on this better inspiratory film  with no findings suggesting ongoing CHF.      Impression:        1. Prior median sternotomy. Mild cardiomegaly. No CHF.  2. No acute pulmonary or pleural process.    Annabell Sabal, MD   04/01/2015 9:26 PM      XR Chest  AP Portable [[id number]] Collected:  04/01/15 1801    Order Status:  Completed Updated:  04/01/15 1807    Narrative:      CLINICAL INFORMATION: 59 year old. Chest pain. History of coronary  artery surgery.    Technique and findings: Semiupright portable chest at 1729 hours. No  comparisons.    Status post median sternotomy. Shallow inspiration. Blunting of the left  lateral costophrenic angle likely due to cardiomegaly and possible  postoperative pleural thickening. No convincing evidence of pneumonia.  Consider two-view study for better assessment. No definite pleural  effusion or pneumothorax. Moderate cardiomegaly accentuated by shallow  inspiration. Prominent upper lobe vessels consistent with vascular  congestion. No pulmonary edema.      Impression:        1. Moderate cardiomegaly with vascular congestion. No pulmonary edema.  2. Opacity in the left lower lateral chest likely due to cardiac  enlargement and pleural thickening. Consider two-view study for better  assessment. Lungs and pleural space otherwise clear.    Annabell Sabal, MD   04/01/2015 6:03 PM          Assessment:   1.  Hospitalization with atypical chest burning similar to recent MI and GERD. No ACS.  2. 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 Morbid obesity  4. Untreated sleep apnea  5. HTN  6. HLD  7. Former smoker  8. On amiodarone post CABG, but pt does not give history of AFib      Plan:   We are trying to obtain his records from Dr. Faustino Congress in [place].  Will proceed with lexiscan nuclear stress test today and echo  Continue present meds.  NPO for now.    Signed by: Urban Gibson, MD, [facility]  [facility]  [facility] [employer] [phone] or [phone] (M-F 8 am-5 pm)  [facility] [employer] [phone]  (M-F 8 am-5 pm)  After Hours Non Urgent Consult Line: [phone]  After Hours Urgent Consults: [phone]  Office: [phone]    cc:No primary care provider on file.  Reviewed records from [place]. Pt had LIMA to LAD. 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 Had totaled RCA with left to right collaterals and  Severe OM disease not bypassed. Post CABG had shock. 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 Plan at discharge was med therapy vs PCI. Will see what nuclear images show. Have requested CD of films.      Nuclear stress test is normal with EF>70%. Echo is pending. As long as echo without major new finding (RV systolic dysfunction was noted pre and post CABG), Pt may be discharged home and follow up with his cardiologist in [place].    Demetrius Revel, MD, [facility]

## 2015-04-02 NOTE — UM Notes (Signed)
59 YEAR OLD MALE CAME TO [facility] ER WITH C/O CHEST PAIN THAT RESEMBLED THE SAME PAIN HE HAD WITH PREVIOUS MI 3 MO AGO.     VS-BP-142/65, P-74, R-28, T-97.0  LOP 6/10    LABS-CR 1.6, TROP 0..00, BNP 265,     CXR-MILD CARDIOMEGALY     [facility]H-QUADRUPLE BYPASS, MI, ASTHMA, HTN, CABG, GASTRIC BYPASS, HERNIA REPAIR, KNEE SURGERY    PLAN-ADMIT TO OBSERVATION CLASS 04/01/15 1840    TELE, NTG PRN, SERIAL CE, O2 NC CARDIOLOGY CONSULT, IV MORPHINE PRN PAIN, HOME MEDS,      Chest Pain: Observation Care - Observation Care Admission Criteria Met

## 2015-04-02 NOTE — Discharge Instructions (Signed)
Chest Pain, Uncertain Cause  Chest pain can happen for a number of reasons. Sometimes the cause can not be determined. If yourcondition does not seem serious, and your pain does not appear to be coming from your heart, your doctor may recommend watching it closely. Sometimes the signs of a serious problem take more time to appear. Therefore, watch for the warning signs listed below.  Home care  After your visit, follow these recommendations:   Rest today and avoid strenuous activity.   Take any prescribed medicine as directed.  Follow-up care  Follow up with your doctor or this facility as instructed or if you do not start to feel better within 24 hours.  Call 911  Get immediate medical attention if any of the following occur:   A change in the type of pain: if it feels different, becomes more severe, lasts longer, or begins to spread into your shoulder, arm, neck, jaw or back   Shortness of breath or increased pain with breathing   Weakness, dizziness, or fainting   Rapid heart beat  Get prompt medical attention  Call your doctor right away if any of the following occur:   Cough with dark colored sputum (phlegm) or blood   Fever of 100.75F(38C) or higher, or as directed by your health care provider   Swelling, pain or redness in one leg   2000-2015 [employer] [address] [place], [place] [address]. All rights reserved. This information is not intended as a substitute for professional medical care. Always follow your healthcare professional's instructions.      Omeprazole, Sodium Bicarbonate Oral capsule  What is this medicine?  OMEPRAZOLE; SODIUM BICARBONATE (oh ME pray zol; SOE dee um bye KAR bon ate) prevents the production of acid in the stomach. It is used to treat gastroesophageal reflux disease (GERD), ulcers, and inflammation of the esophagus.  This medicine may be used for other purposes; ask your health care provider or pharmacist if you have questions.  What should I tell  my health care provider before I take this medicine?  They need to know if you have any of these conditions:   Bartter's syndrome   kidney disease   history of low levels of calcium, magnesium, or potassium in the blood   low salt diet   metabolic alkalosis   an unusual reaction to omeprazole, sodium bicarbonate, other medicines, foods, dyes, or preservatives   pregnant or trying to get pregnant   breast-feeding  How should I use this medicine?  Take this medicine by mouth with a glass of water. Do not take with other drinks or foods. Follow the directions on the prescription label. Do not cut, crush or chew the capsules. Take this medicine on an empty stomach, at least 1 hour before a meal. Take your medicine at regular intervals. Do not take your medicine more often than directed. Do not stop taking except on your doctor's advice.  Talk to your pediatrician regarding the use of this medicine in children. Special care may be needed.  Overdosage: If you think you have taken too much of this medicine contact a poison control center or emergency room at once.  NOTE: This medicine is only for you. Do not share this medicine with others.  What if I miss a dose?  If you miss a dose, take it as soon as you can. If it is almost time for your next dose, take only that dose. Do not take double or extra  doses.  What may interact with this medicine?  Do not take this medicine with any of the following medications:   atazanavir   clopidogrel   nelfinavir  This medicine may also interact with the following medications:   antibiotics like ampicillin, clarithromycin, tetracycline   antifungals like itraconazole, ketoconazole, and voriconazole   antivirals like delavirdine, efavirenz, indinavir, saquinavir   certain medicines that treat or prevent blood clots like warfarin   cyclosporine   dasatinib   digoxin   disulfiram   diuretics   iron supplements   medicines for anxiety, panic, and sleep like  diazepam   medicines for seizures like carbamazepine, phenobarbital, phenytoin   methotrexate   mycophenolate mofetil   other medicines for stomach acid   tacrolimus   tolmetin   vitamin B12  This list may not describe all possible interactions. Give your health care provider a list of all the medicines, herbs, non-prescription drugs, or dietary supplements you use. Also tell them if you smoke, drink alcohol, or use illegal drugs. Some items may interact with your medicine.  What should I watch for while using this medicine?  Visit your doctor for regular check ups. Tell your doctor if your symptoms do not get better or if they get worse.  Do not treat diarrhea with over the counter products. Contact your doctor if you have diarrhea that lasts more than 2 days or if it is severe and watery.  You may need blood work done while you are taking this medicine.  What side effects may I notice from receiving this medicine?  Side effects that you should report to your doctor or health care professional as soon as possible:   allergic reactions like skin rash, itching or hives, swelling of the face, lips, or tongue   bone, muscle or joint pain   breathing problems   chest pain   dark urine   diarrhea   dizziness   fast, irregular heartbeat   feeling faint or lightheaded, falls   fever or sore throat   high blood pressure   palpitations   muscle spasms   redness, blistering, peeling or loosening of the skin, including inside the mouth   seizures   stomach pain   swelling of the ankles, legs   tremors   trouble passing urine or change in the amount of urine   unusual bleeding, bruising   unusually weak or tired   yellowing of the eyes or skin  Side effects that usually do not require medical attention (report to your doctor or health care professional if they continue or are bothersome):   constipation   dry mouth   headache   loose stools   nausea  This list may not describe all possible side  effects. Call your doctor for medical advice about side effects. You may report side effects to FDA at 1-800-FDA-1088.  Where should I keep my medicine?  Keep out of the reach of children.  Store at room temperature between 15 and 30 degrees C (59 and 86 degrees F). Protect from moisture. Throw away any unused medicine after the expiration date.  NOTE:This sheet is a summary. It may not cover all possible information. If you have questions about this medicine, talk to your doctor, pharmacist, or health care provider. Copyright 2015 [employer]

## 2015-04-02 NOTE — Plan of Care (Signed)
Problem: Health Promotion  Goal: Knowledge - disease process  Extent of understanding conveyed about a specific disease process.   Outcome: Progressing    Problem: Safety  Goal: Patient will be free from injury during hospitalization  Outcome: Progressing    Problem: Pain  Goal: Patient's pain/discomfort is manageable  Outcome: Progressing    Problem: Psychosocial and Spiritual Needs  Goal: Demonstrates ability to cope with hospitalization/illness  Outcome: Progressing    Problem: Moderate/High Fall Risk Score >5  Goal: Patient will remain free of falls  Outcome: Progressing    Problem: Chest Pain  Goal: Vital signs and cardiac rhythm stable  Outcome: Progressing

## 2015-04-02 NOTE — Discharge Summary (Signed)
Date:04/02/2015   Patient Name: Justin Hernandez  Attending Physician: Roe Coombs,*  Today:   BP 134/68 mmHg  Pulse 73  Temp(Src) 96.7 F (35.9 C) (Oral)  Resp 18  Ht 1.778 m (5\' 10" )  Wt 158.759 kg (350 lb)  BMI 50.22 kg/m2  SpO2 99%  Ranges for the last 24 hours:  Temp:  [96.7 F (35.9 C)-97.4 F (36.3 C)] 96.7 F (35.9 C)  Heart Rate:  [70-78] 73  Resp Rate:  [15-28] 18  BP: (104-150)/(50-78) 134/68 mmHg    Date of Admission:   04/01/2015    Date of Discharge:   04/02/2015    Outcome of Hospitalization:   Active Problems:    Chest pain    Chest pain, unspecified chest pain type    Coronary artery disease involving native heart with angina pectoris, unspecified vessel or lesion type    S/P CABG (coronary artery bypass graft)    Morbid obesity, unspecified obesity type  Resolved Problems:    * No resolved hospital problems. *     Significant Events: serial cardiac enzymes were cycled with negative results and 12 lead EKG was unremarkable. Tele overnight indicated multifocal PVCs with sinus bradycardia. Cardiology consulted with recommendation for inpatient ischemic evaluation. Lexiscan is negative for ischemia with EF of 75%. Echocardiogram also obtained and if negative for regional wall abnormalities with normal LVEF, patient may be discharged. Prilosec ordered on discharge for acid suppression as a possible cause of chest pain. Patient is to follow up with cardiology in 2-4 weeks.    Lab Results last 48 Hours     Procedure Component Value Units Date/Time    T3 [[id number]] Collected:  04/02/15 0810    Specimen Information:  Blood Updated:  04/02/15 1135     T3 0.54 ng/mL     T4, free [[id number]] Collected:  04/02/15 0809    Specimen Information:  Blood Updated:  04/02/15 0850     T4 Free 0.85 ng/dL     Lipid panel [[id number]] Collected:  04/02/15 0511    Specimen Information:  Blood Updated:  04/02/15 0755     Cholesterol 115 mg/dL      Triglycerides 57 mg/dL      HDL 60 mg/dL      LDL  Calculated 44 mg/dL      VLDL Cholesterol Cal 11 mg/dL      CHOL/HDL Ratio 1.9     Hemolysis index [[id number]]  (Abnormal) Collected:  04/02/15 0511     Hemolysis Index 25 (H) Updated:  04/02/15 0755    Hemoglobin A1C [[id number]] Collected:  04/01/15 2333    Specimen Information:  Blood Updated:  04/02/15 0654     Hemoglobin A1C 5.8 %     Basic Metabolic Panel [[id number]]  (Abnormal) Collected:  04/02/15 0511    Specimen Information:  Blood Updated:  04/02/15 0648     Glucose 86 mg/dL      BUN 53.6 mg/dL      Creatinine 1.6 (H) mg/dL      Calcium 8.7 mg/dL      Sodium 644 mEq/L      Potassium 4.8 mEq/L      Chloride 107 mEq/L      CO2 22 mEq/L     GFR [[id number]] Collected:  04/02/15 0511     EGFR 44.2 Updated:  04/02/15 0648    Troponin I [[id number]] Collected:  04/02/15 0511    Specimen Information:  Blood Updated:  04/02/15 0612     Troponin  I <0.01 ng/mL     CBC with differential [[id number]]  (Abnormal) Collected:  04/02/15 0511    Specimen Information:  Blood from Blood Updated:  04/02/15 0551     WBC 7.75 x10 3/uL      Hgb 10.0 (L) g/dL      Hematocrit 40.3 (L) %      Platelets 285 x10 3/uL      RBC 4.06 (L) x10 6/uL      MCV 82.3 fL      MCH 24.6 (L) pg      MCHC 29.9 (L) g/dL      RDW 19 (H) %      MPV 10.4 fL      Neutrophils 66 %      Lymphocytes Automated 19 %      Monocytes 12 %      Eosinophils Automated 2 %      Basophils Automated 1 %      Immature Granulocyte 0 %      Nucleated RBC 0 /100 WBC      Neutrophils Absolute 5.11 x10 3/uL      Abs Lymph Automated 1.45 x10 3/uL      Abs Mono Automated 0.95 x10 3/uL      Abs Eos Automated 0.15 x10 3/uL      Absolute Baso Automated 0.08 x10 3/uL      Absolute Immature Granulocyte 0.01 x10 3/uL     TSH [[id number]]  (Abnormal) Collected:  04/01/15 2333    Specimen Information:  Blood Updated:  04/02/15 0029     Thyroid Stimulating Hormone 13.98 (H) uIU/mL     Troponin I [[id number]] Collected:  04/01/15 2333    Specimen Information:  Blood Updated:  04/02/15 0011      Troponin I <0.01 ng/mL     B-type Natriuretic Peptide [[id number]]  (Abnormal) Collected:  04/01/15 1728    Specimen Information:  Blood Updated:  04/01/15 2054     B-Natriuretic Peptide 265 (H) pg/mL     i-Stat Troponin [[id number]] Collected:  04/01/15 1728     i-STAT Troponin 0.00 ng/mL Updated:  04/01/15 1752    Basic Metabolic Panel [[id number]]  (Abnormal) Collected:  04/01/15 1728    Specimen Information:  Blood Updated:  04/01/15 1751     Glucose 72 mg/dL      BUN 02.5 mg/dL      Creatinine 1.5 (H) mg/dL      Calcium 9.2 mg/dL      Sodium 427 mEq/L      Potassium 4.5 mEq/L      Chloride 106 mEq/L      CO2 21 (L) mEq/L     GFR [[id number]] Collected:  04/01/15 1728     EGFR 47.6 Updated:  04/01/15 1751    CBC with differential [[id number]]  (Abnormal) Collected:  04/01/15 1728    Specimen Information:  Blood from Blood Updated:  04/01/15 1750     WBC 7.74 x10 3/uL      Hgb 10.6 (L) g/dL      Hematocrit 37.1 (L) %      Platelets 308 x10 3/uL      RBC 4.18 (L) x10 6/uL      MCV 82.1 fL      MCH 25.4 (L) pg      MCHC 30.9 (L) g/dL      RDW 18 (H) %      MPV 10.6 fL      Neutrophils 70 %  Lymphocytes Automated 18 %      Monocytes 8 %      Eosinophils Automated 2 %      Basophils Automated 1 %      Immature Granulocyte 0 %      Nucleated RBC 0 /100 WBC      Neutrophils Absolute 5.45 x10 3/uL      Abs Lymph Automated 1.38 x10 3/uL      Abs Mono Automated 0.66 x10 3/uL      Abs Eos Automated 0.16 x10 3/uL      Absolute Baso Automated 0.08 x10 3/uL      Absolute Immature Granulocyte 0.01 x10 3/uL           Procedures performed:   Radiology: all results from this admission  Xr Chest 2 Views    04/01/2015    1. Prior median sternotomy. Mild cardiomegaly. No CHF. 2. No acute pulmonary or pleural process.  Annabell Sabal, MD  04/01/2015 9:26 PM     Nm Myocardial Perfusion Spect (stress And Rest)    04/02/2015    Normal stress test  The impression above is as per the stressing MD. Please see separate stress report for  details.  INTERPRETATION: Images were reviewed in the horizontal long axis, vertical long axis, and short axis. Review of the [facility] quantification data and polar plots was also performed during this review.  The overall quality of the study is fair and limited by motion and soft tissue attenuation attenuation artifact. Left ventricular cavity is noted to be normal in size on the rest and/or stress images. SPECT images demonstrate a normal perfusion pattern with no significant defects or areas of reversibility seen.  Gated SPECT imaging reveals septal hypokinesis consistent with postoperative state with otherwise normal myocardial thickening and wall motion. The left ventricular ejection fraction was calculated to be 78%.   1. Normal pharmacologic stress and rest Myoview study without evidence of inducible ischemia or scar. 2. Normal left ventricular function with an LVEF of greater than 75%. 3. No prior study is available for comparison.   Venia Carbon, MD  04/02/2015 3:36 PM     Xr Chest  Ap Portable    04/01/2015    1. Moderate cardiomegaly with vascular congestion. No pulmonary edema. 2. Opacity in the left lower lateral chest likely due to cardiac enlargement and pleural thickening. Consider two-view study for better assessment. Lungs and pleural space otherwise clear.  Annabell Sabal, MD  04/01/2015 6:03 PM     Cv Cardiac Stress Test Tracing Only    04/02/2015    Normal stress test  The impression above is as per the stressing MD. Please see separate stress report for details.  INTERPRETATION: Images were reviewed in the horizontal long axis, vertical long axis, and short axis. Review of the [facility] quantification data and polar plots was also performed during this review.  The overall quality of the study is fair and limited by motion and soft tissue attenuation attenuation artifact. Left ventricular cavity is noted to be normal in size on the rest and/or stress images. SPECT images demonstrate a  normal perfusion pattern with no significant defects or areas of reversibility seen.  Gated SPECT imaging reveals septal hypokinesis consistent with postoperative state with otherwise normal myocardial thickening and wall motion. The left ventricular ejection fraction was calculated to be 78%.   1. Normal pharmacologic stress and rest Myoview study without evidence of inducible ischemia or scar. 2. Normal left  ventricular function with an LVEF of greater than 75%. 3. No prior study is available for comparison.   Venia Carbon, MD  04/02/2015 3:36 PM       Treatment Team:   Attending Provider: Roe Coombs, MD  Consulting Physician: Tonie Griffith, MD    Disposition:   Disposition:      Condition at Discharge:   stable     Follow up Recommendations for Receiving Provider     1. Follow up with cardiology in 2-4 weeks   2. Prilosec trial for acid suppression    Unresulted Labs     Procedure . . . Date/Time    Echocardiogram Adult With Contrast Comp W Clr/Dop [[id number]] Resulted:  04/02/15 1605     Updated:  04/02/15 1654          Discharge Instructions:     Follow-up Information     Follow up with Urban Gibson, MD In 2 weeks.    Specialty:  Cardiology    Contact information:    [address]  [place] [address]  [phone]             Discharge Medication List      Taking          amiodarone 200 MG tablet   Dose:  200 mg   Commonly known as:  PACERONE   Take 200 mg by mouth daily.       aspirin EC 81 MG EC tablet   Dose:  81 mg   Take 81 mg by mouth daily.       atorvastatin 40 MG tablet   Dose:  40 mg   Commonly known as:  LIPITOR   Take 40 mg by mouth daily.       benzonatate 200 MG capsule   Dose:  200 mg   Commonly known as:  TESSALON   Take 200 mg by mouth 3 (three) times daily as needed for Cough.       FLOMAX 0.4 MG Caps   Dose:  0.4 mg   Generic drug:  tamsulosin   Take 0.4 mg by mouth Daily after dinner.       furosemide 40 MG tablet   Dose:  40 mg   Commonly known as:  LASIX   Take  40 mg by mouth 2 (two) times daily.       lisinopril 2.5 MG tablet   Dose:  2.5 mg   Commonly known as:  PRINIVIL,ZESTRIL   Take 2.5 mg by mouth daily.       metoprolol XL 25 MG 24 hr tablet   Dose:  25 mg   Commonly known as:  TOPROL-XL   Take 25 mg by mouth daily.       omeprazole 20 MG capsule   Dose:  20 mg   Commonly known as:  PriLOSEC   Take 1 capsule (20 mg total) by mouth daily.       POTASSIUM PO   Take by mouth.       spironolactone 25 MG tablet   Dose:  25 mg   Commonly known as:  ALDACTONE   Take 25 mg by mouth daily.       vitamin B-1 100 MG tablet   Dose:  100 mg   Commonly known as:  THIAMINE   Take 100 mg by mouth daily.             Signed by: Nechama Guard, NP

## 2015-04-02 NOTE — Progress Notes (Addendum)
Patient alert and oriented x4, MAE,VSS assessment as per EPIC, denies c/o pain, with non-productive cough, on 2L/NC, RT as per orders, on telemetry NSR, completed  myocardial perfusion test  And echocardiogram today, will continue to assess and follow through with plan of care.  Patient d/c to home, discharge instructions read explained and given to patient, patient verbalized understanding of d/c instructions, telemetry and PIV d/c'd, pt. awaiting transport to lobby.

## 2015-04-03 ENCOUNTER — Telehealth (INDEPENDENT_AMBULATORY_CARE_PROVIDER_SITE_OTHER): Payer: Self-pay

## 2015-04-03 NOTE — Telephone Encounter (Signed)
Pt called stating his work is requesting a note from Dr Nile Riggs stating it is okay to return to work.  Pt would like as soon as possible.  Pt seen in hospital only.  No OV of record or scheduled.

## 2015-04-04 ENCOUNTER — Encounter (INDEPENDENT_AMBULATORY_CARE_PROVIDER_SITE_OTHER): Payer: Self-pay | Admitting: Cardiovascular Disease

## 2015-04-04 NOTE — Progress Notes (Signed)
Jul 12, 1956  Justin Hernandez     This patient may return to his work in [place] area. He told me that his job does not involve heavy physical activity.    Demetrius Revel, MD, [facility]

## 2015-04-13 DIAGNOSIS — I10 Essential (primary) hypertension: Secondary | ICD-10-CM | POA: Insufficient documentation

## 2018-09-17 ENCOUNTER — Encounter: Payer: Self-pay | Admitting: Emergency Medicine

## 2018-09-17 ENCOUNTER — Other Ambulatory Visit: Payer: Self-pay

## 2018-09-17 ENCOUNTER — Ambulatory Visit
Admission: EM | Admit: 2018-09-17 | Discharge: 2018-09-17 | Disposition: A | Discharge status: Home / Self Care | Payer: Medicare Other | Attending: Family Medicine | Admitting: Family Medicine

## 2018-09-17 DIAGNOSIS — R05 Cough: Secondary | ICD-10-CM | POA: Diagnosis not present

## 2018-09-17 DIAGNOSIS — J40 Bronchitis, not specified as acute or chronic: Secondary | ICD-10-CM | POA: Diagnosis not present

## 2018-09-17 DIAGNOSIS — Z87891 Personal history of nicotine dependence: Secondary | ICD-10-CM | POA: Diagnosis not present

## 2018-09-17 DIAGNOSIS — R059 Cough, unspecified: Secondary | ICD-10-CM

## 2018-09-17 HISTORY — DX: Essential (primary) hypertension: I10

## 2018-09-17 HISTORY — DX: Disorder of thyroid, unspecified: E07.9

## 2018-09-17 MED ORDER — HYDROCOD POLST-CPM POLST ER 10-8 MG/5ML PO SUER: 5.0000 mL | Freq: Every evening | ORAL | 0 refills | Status: AC | PRN

## 2018-09-17 MED ORDER — DOXYCYCLINE HYCLATE 100 MG PO TABS: 100.0000 mg | ORAL_TABLET | Freq: Two times a day (BID) | ORAL | 0 refills | Status: AC

## 2018-09-17 MED ORDER — PREDNISONE 20 MG PO TABS: 20.0000 mg | ORAL_TABLET | Freq: Every day | ORAL | 0 refills | Status: AC

## 2018-09-17 NOTE — ED Triage Notes (Signed)
Patient c/o cough and chest congestion for 3 weeks.  Patient denies fevers.  

## 2018-09-17 NOTE — ED Provider Notes (Signed)
[facility]    CSN: [id number] Arrival date & time: 09/17/18  1034     History   Chief Complaint Chief Complaint  Patient presents with  . Cough    HPI Gary Moore is a 63 y.o. male.   The history is provided by the patient.  Cough  Associated symptoms: no wheezing   URI  Presenting symptoms: congestion and cough   Severity:  Moderate Onset quality:  Sudden Duration:  3 weeks Timing:  Constant Progression:  Worsening Chronicity:  New Relieved by:  Nothing Ineffective treatments:  OTC medications Associated symptoms: no wheezing   Risk factors: sick contacts     Past Medical History:  Diagnosis Date  . Hypertension   . Thyroid disease     There are no active problems to display for this patient.   Past Surgical History:  Procedure Laterality Date  . CARDIAC SURGERY    . GASTRIC BYPASS    . REPLACEMENT TOTAL KNEE BILATERAL         Home Medications    Prior to Admission medications   Medication Sig Start Date End Date Taking? Authorizing Provider  allopurinol (ZYLOPRIM) 300 MG tablet Take 300 mg by mouth daily. 03/29/18  Yes [provider]  atorvastatin (LIPITOR) 20 MG tablet Take 20 mg by mouth daily. 07/16/18  Yes [provider]  bumetanide (BUMEX) 1 MG tablet Take 1 mg by mouth 2 (two) times daily. 05/29/18  Yes [provider]  clopidogrel (PLAVIX) 75 MG tablet Take 75 mg by mouth daily. 07/16/18  Yes [provider]  donepezil (ARICEPT) 10 MG tablet  09/11/18  Yes [provider]  fluticasone (FLONASE) 50 MCG/ACT nasal spray  08/13/18  Yes [provider]  furosemide (LASIX) 40 MG tablet Take 40 mg by mouth daily. 08/26/18  Yes [provider]  gabapentin (NEURONTIN) 300 MG capsule  08/05/18  Yes [provider]  isosorbide mononitrate (IMDUR) 30 MG 24 hr tablet Take 30 mg by mouth daily. 07/27/18  Yes [provider]  levothyroxine (SYNTHROID,  LEVOTHROID) 75 MCG tablet TAKE 1 TABLET BY MOUTH ONCE DAILY EXCEPT ON FRIDAYS TAKE ONLY A 1 2 TABLET 08/27/18  Yes [provider]  metoprolol tartrate (LOPRESSOR) 50 MG tablet  08/30/18  Yes [provider]  nitroGLYCERIN (NITROSTAT) 0.4 MG SL tablet DISSOLVE ONE TABLET UNDER THE TONGUE EVERY 5 MINUTES AS NEEDED FOR CHEST PAIN. DO NOT EXCEED A TOTAL OF 3 DOSES IN 15 MINUTES GO TO ER IF NO 06/15/18  Yes [provider]  traMADol (ULTRAM) 50 MG tablet Take 50 mg by mouth every 8 (eight) hours as needed. 08/27/18  Yes [provider]  chlorpheniramine-HYDROcodone (TUSSIONEX PENNKINETIC ER) 10-8 MG/5ML SUER Take 5 mLs by mouth at bedtime as needed. 09/17/18   Payton Mccallum, MD  doxycycline (VIBRA-TABS) 100 MG tablet Take 1 tablet (100 mg total) by mouth 2 (two) times daily. 09/17/18   Payton Mccallum, MD  predniSONE (DELTASONE) 20 MG tablet Take 1 tablet (20 mg total) by mouth daily. 09/17/18   Payton Mccallum, MD    Family History Family History  Problem Relation Age of Onset  . Hypertension Mother   . Diabetes Mother   . Hypertension Father     Social History Social History   Tobacco Use  . Smoking status: Former Games developer  . Smokeless tobacco: Never Used  Substance Use Topics  . Alcohol use: Yes  . Drug use: Never     Allergies  Patient has no known allergies.   Review of Systems Review of Systems  HENT: Positive for congestion.   Respiratory: Positive for cough. Negative for wheezing.      Physical Exam Triage Vital Signs ED Triage Vitals  Enc Vitals Group     BP 09/17/18 1058 115/69     Pulse Rate 09/17/18 1058 (!) 56     Resp 09/17/18 1058 16     Temp 09/17/18 1058 97.6 F (36.4 C)     Temp Source 09/17/18 1058 Oral     SpO2 09/17/18 1058 99 %     Weight 09/17/18 1054 250 lb (113.4 kg)     Height 09/17/18 1054 5\' 10"  (1.778 m)     Head Circumference --      Peak Flow --      Pain Score 09/17/18 1054 2     Pain Loc --      Pain  Edu? --      Excl. in GC? --    No data found.  Updated Vital Signs BP 115/69 (BP Location: Right Arm)   Pulse (!) 56   Temp 97.6 F (36.4 C) (Oral)   Resp 16   Ht 5\' 10"  (1.778 m)   Wt 113.4 kg   SpO2 99%   BMI 35.87 kg/m   Visual Acuity Right Eye Distance:   Left Eye Distance:   Bilateral Distance:    Right Eye Near:   Left Eye Near:    Bilateral Near:     Physical Exam Vitals signs and nursing note reviewed.  Constitutional:      General: He is not in acute distress.    Appearance: He is well-developed. He is not diaphoretic.  HENT:     Head: Normocephalic and atraumatic.     Right Ear: Tympanic membrane, ear canal and external ear normal.     Left Ear: Tympanic membrane, ear canal and external ear normal.     Nose: Nose normal.     Mouth/Throat:     Pharynx: Uvula midline. No oropharyngeal exudate.     Tonsils: No tonsillar abscesses.  Neck:     Musculoskeletal: Normal range of motion and neck supple.     Thyroid: No thyromegaly.     Trachea: No tracheal deviation.  Cardiovascular:     Rate and Rhythm: Normal rate and regular rhythm.     Heart sounds: Normal heart sounds.  Pulmonary:     Effort: Pulmonary effort is normal. No respiratory distress.     Breath sounds: No stridor. Rhonchi present. No wheezing or rales.  Chest:     Chest wall: No tenderness.  Lymphadenopathy:     Cervical: No cervical adenopathy.  Skin:    General: Skin is warm and dry.     Findings: No rash.  Neurological:     Mental Status: He is alert.      UC Treatments / Results  Labs (all labs ordered are listed, but only abnormal results are displayed) Labs Reviewed - No data to display  EKG None  Radiology No results found.  Procedures Procedures (including critical care time)  Medications Ordered in UC Medications - No data to display  Initial Impression / Assessment and Plan / UC Course  I have reviewed the triage vital signs and the nursing notes.  Pertinent  labs & imaging results that were available during my care of the patient were reviewed by me and considered in my medical decision making (see chart for details).  Final Clinical Impressions(s) / UC Diagnoses   Final diagnoses:  Cough  Bronchitis    ED Prescriptions    Medication Sig Dispense Auth. Provider   doxycycline (VIBRA-TABS) 100 MG tablet Take 1 tablet (100 mg total) by mouth 2 (two) times daily. 20 tablet Payton Mccallumonty, Gio Janoski, MD   predniSONE (DELTASONE) 20 MG tablet Take 1 tablet (20 mg total) by mouth daily. 5 tablet Payton Mccallumonty, Shalita Notte, MD   chlorpheniramine-HYDROcodone (TUSSIONEX PENNKINETIC ER) 10-8 MG/5ML SUER Take 5 mLs by mouth at bedtime as needed. 60 mL Payton Mccallumonty, Onesty Clair, MD     1. diagnosis reviewed with patient 2. rx as per orders above; reviewed possible side effects, interactions, risks and benefits  3. Recommend supportive treatment with rest, fluids 4. Follow-up prn if symptoms worsen or don't improve    Controlled Substance Prescriptions [place] Controlled Substance Registry consulted? Not Applicable   Payton Mccallumonty, Toluwani Ruder, MD 09/17/18 1200

## 2018-09-21 ENCOUNTER — Telehealth: Payer: Self-pay | Admitting: Family Medicine

## 2018-09-21 MED ORDER — HYDROCOD POLST-CPM POLST ER 10-8 MG/5ML PO SUER: 5.0000 mL | Freq: Every evening | ORAL | 0 refills | Status: DC | PRN

## 2018-09-21 NOTE — Telephone Encounter (Signed)
Additional cough medication sent as [facility] only gave him 35 mL.

## 2018-10-05 ENCOUNTER — Encounter: Payer: Self-pay | Admitting: Emergency Medicine

## 2018-10-05 ENCOUNTER — Observation Stay
Admission: EM | Admit: 2018-10-05 | Discharge: 2018-10-06 | Disposition: A | Discharge status: Home / Self Care | Payer: Medicare Other | Attending: Internal Medicine | Admitting: Internal Medicine

## 2018-10-05 ENCOUNTER — Ambulatory Visit
Admission: EM | Admit: 2018-10-05 | Discharge: 2018-10-05 | Disposition: A | Discharge status: Other Acute Inpatient Hospital | Payer: Medicare Other | Source: Home / Self Care | Attending: Family Medicine | Admitting: Family Medicine

## 2018-10-05 ENCOUNTER — Observation Stay
Admit: 2018-10-05 | Discharge: 2018-10-05 | Disposition: A | Discharge status: Home / Self Care | Payer: Medicare Other | Attending: Internal Medicine | Admitting: Internal Medicine

## 2018-10-05 ENCOUNTER — Other Ambulatory Visit: Payer: Self-pay

## 2018-10-05 ENCOUNTER — Emergency Department: Payer: Medicare Other

## 2018-10-05 DIAGNOSIS — Z951 Presence of aortocoronary bypass graft: Secondary | ICD-10-CM | POA: Insufficient documentation

## 2018-10-05 DIAGNOSIS — Z7952 Long term (current) use of systemic steroids: Secondary | ICD-10-CM | POA: Diagnosis not present

## 2018-10-05 DIAGNOSIS — I5032 Chronic diastolic (congestive) heart failure: Secondary | ICD-10-CM | POA: Diagnosis not present

## 2018-10-05 DIAGNOSIS — E079 Disorder of thyroid, unspecified: Secondary | ICD-10-CM | POA: Diagnosis not present

## 2018-10-05 DIAGNOSIS — Z955 Presence of coronary angioplasty implant and graft: Secondary | ICD-10-CM | POA: Insufficient documentation

## 2018-10-05 DIAGNOSIS — Z885 Allergy status to narcotic agent status: Secondary | ICD-10-CM | POA: Diagnosis not present

## 2018-10-05 DIAGNOSIS — Z79890 Hormone replacement therapy (postmenopausal): Secondary | ICD-10-CM | POA: Diagnosis not present

## 2018-10-05 DIAGNOSIS — Z96653 Presence of artificial knee joint, bilateral: Secondary | ICD-10-CM | POA: Diagnosis not present

## 2018-10-05 DIAGNOSIS — I25110 Atherosclerotic heart disease of native coronary artery with unstable angina pectoris: Principal | ICD-10-CM | POA: Insufficient documentation

## 2018-10-05 DIAGNOSIS — Z7902 Long term (current) use of antithrombotics/antiplatelets: Secondary | ICD-10-CM | POA: Insufficient documentation

## 2018-10-05 DIAGNOSIS — Z8249 Family history of ischemic heart disease and other diseases of the circulatory system: Secondary | ICD-10-CM | POA: Diagnosis not present

## 2018-10-05 DIAGNOSIS — R0789 Other chest pain: Secondary | ICD-10-CM

## 2018-10-05 DIAGNOSIS — G4733 Obstructive sleep apnea (adult) (pediatric): Secondary | ICD-10-CM | POA: Insufficient documentation

## 2018-10-05 DIAGNOSIS — Z8679 Personal history of other diseases of the circulatory system: Secondary | ICD-10-CM

## 2018-10-05 DIAGNOSIS — R079 Chest pain, unspecified: Secondary | ICD-10-CM

## 2018-10-05 DIAGNOSIS — I110 Hypertensive heart disease with heart failure: Secondary | ICD-10-CM | POA: Diagnosis not present

## 2018-10-05 DIAGNOSIS — E669 Obesity, unspecified: Secondary | ICD-10-CM | POA: Insufficient documentation

## 2018-10-05 DIAGNOSIS — I259 Chronic ischemic heart disease, unspecified: Secondary | ICD-10-CM | POA: Diagnosis not present

## 2018-10-05 DIAGNOSIS — Z79899 Other long term (current) drug therapy: Secondary | ICD-10-CM | POA: Diagnosis not present

## 2018-10-05 DIAGNOSIS — Z87891 Personal history of nicotine dependence: Secondary | ICD-10-CM

## 2018-10-05 DIAGNOSIS — I208 Other forms of angina pectoris: Secondary | ICD-10-CM | POA: Diagnosis present

## 2018-10-05 DIAGNOSIS — Z9884 Bariatric surgery status: Secondary | ICD-10-CM | POA: Diagnosis not present

## 2018-10-05 LAB — HEPATIC FUNCTION PANEL
ALT: 34 U/L (ref 0–44)
AST: 49 U/L — ABNORMAL HIGH (ref 15–41)
Albumin: 4 g/dL (ref 3.5–5.0)
Alkaline Phosphatase: 105 U/L (ref 38–126)
Bilirubin, Direct: 0.2 mg/dL (ref 0.0–0.2)
Indirect Bilirubin: 0.4 mg/dL (ref 0.3–0.9)
Total Bilirubin: 0.6 mg/dL (ref 0.3–1.2)
Total Protein: 7 g/dL (ref 6.5–8.1)

## 2018-10-05 LAB — CBC
HCT: 42.5 % (ref 39.0–52.0)
Hemoglobin: 13.7 g/dL (ref 13.0–17.0)
MCH: 29.5 pg (ref 26.0–34.0)
MCHC: 32.2 g/dL (ref 30.0–36.0)
MCV: 91.4 fL (ref 80.0–100.0)
Platelets: 251 10*3/uL (ref 150–400)
RBC: 4.65 MIL/uL (ref 4.22–5.81)
RDW: 15 % (ref 11.5–15.5)
WBC: 8.3 10*3/uL (ref 4.0–10.5)
nRBC: 0 % (ref 0.0–0.2)

## 2018-10-05 LAB — APTT: aPTT: 29 seconds (ref 24–36)

## 2018-10-05 LAB — BASIC METABOLIC PANEL
Anion gap: 12 (ref 5–15)
BUN: 14 mg/dL (ref 8–23)
CO2: 26 mmol/L (ref 22–32)
Calcium: 8.7 mg/dL — ABNORMAL LOW (ref 8.9–10.3)
Chloride: 101 mmol/L (ref 98–111)
Creatinine, Ser: 1.06 mg/dL (ref 0.61–1.24)
GFR calc Af Amer: >60 mL/min (ref >60)
GFR calc non Af Amer: >60 mL/min (ref >60)
Glucose, Bld: 81 mg/dL (ref 70–99)
POTASSIUM: 3.8 mmol/L (ref 3.5–5.1)
Sodium: 139 mmol/L (ref 135–145)

## 2018-10-05 LAB — BRAIN NATRIURETIC PEPTIDE: B Natriuretic Peptide: 108 pg/mL — ABNORMAL HIGH (ref 0.0–100.0)

## 2018-10-05 LAB — TROPONIN I: Troponin I: <0.03 ng/mL (ref <0.03)

## 2018-10-05 LAB — PROTIME-INR
INR: 1 (ref 0.8–1.2)
Prothrombin Time: 13 seconds (ref 11.4–15.2)

## 2018-10-05 LAB — LIPASE, BLOOD: Lipase: 35 U/L (ref 11–51)

## 2018-10-05 MED ORDER — LEVOTHYROXINE SODIUM 75 MCG PO TABS
75.0000 ug | ORAL_TABLET | Freq: Every day | ORAL | Status: DC
  Administered 2018-10-06: 75 ug via ORAL
  Filled 2018-10-05: qty 1

## 2018-10-05 MED ORDER — ACETAMINOPHEN 650 MG RE SUPP
650.0000 mg | Freq: Four times a day (QID) | RECTAL | Status: DC | PRN
  Filled 2018-10-05: qty 1

## 2018-10-05 MED ORDER — ZOLPIDEM TARTRATE 5 MG PO TABS
10.0000 mg | ORAL_TABLET | Freq: Every evening | ORAL | Status: DC | PRN
  Administered 2018-10-05: 10 mg via ORAL
  Filled 2018-10-05 (×2): qty 2

## 2018-10-05 MED ORDER — SODIUM CHLORIDE 0.9% FLUSH
3.0000 mL | Freq: Two times a day (BID) | INTRAVENOUS | Status: DC
  Administered 2018-10-05 – 2018-10-06 (×2): 3 mL via INTRAVENOUS

## 2018-10-05 MED ORDER — POTASSIUM CHLORIDE CRYS ER 10 MEQ PO TBCR
10.0000 meq | EXTENDED_RELEASE_TABLET | Freq: Every day | ORAL | Status: DC
  Administered 2018-10-05 – 2018-10-06 (×2): 10 meq via ORAL
  Filled 2018-10-05 (×2): qty 1

## 2018-10-05 MED ORDER — ALLOPURINOL 300 MG PO TABS
300.0000 mg | ORAL_TABLET | Freq: Every day | ORAL | Status: DC
  Administered 2018-10-05 – 2018-10-06 (×2): 300 mg via ORAL
  Filled 2018-10-05 (×3): qty 1

## 2018-10-05 MED ORDER — ISOSORBIDE MONONITRATE ER 30 MG PO TB24
30.0000 mg | ORAL_TABLET | Freq: Every day | ORAL | Status: DC
  Administered 2018-10-05 – 2018-10-06 (×2): 30 mg via ORAL
  Filled 2018-10-05 (×2): qty 1

## 2018-10-05 MED ORDER — CLOPIDOGREL BISULFATE 75 MG PO TABS
75.0000 mg | ORAL_TABLET | Freq: Every day | ORAL | Status: DC
  Administered 2018-10-05 – 2018-10-06 (×2): 75 mg via ORAL
  Filled 2018-10-05 (×2): qty 1

## 2018-10-05 MED ORDER — ACETAMINOPHEN 325 MG PO TABS
650.0000 mg | ORAL_TABLET | Freq: Four times a day (QID) | ORAL | Status: DC | PRN
  Filled 2018-10-05: qty 2

## 2018-10-05 MED ORDER — DONEPEZIL HCL 5 MG PO TABS
10.0000 mg | ORAL_TABLET | Freq: Every day | ORAL | Status: DC
  Administered 2018-10-05: 10 mg via ORAL
  Filled 2018-10-05 (×2): qty 2

## 2018-10-05 MED ORDER — POLYETHYLENE GLYCOL 3350 17 G PO PACK
17.0000 g | PACK | Freq: Every day | ORAL | Status: DC
  Administered 2018-10-05: 17 g via ORAL
  Filled 2018-10-05 (×2): qty 1

## 2018-10-05 MED ORDER — ONDANSETRON HCL 4 MG PO TABS
4.0000 mg | ORAL_TABLET | Freq: Four times a day (QID) | ORAL | Status: DC | PRN
  Filled 2018-10-05: qty 1

## 2018-10-05 MED ORDER — ASPIRIN EC 81 MG PO TBEC
81.0000 mg | DELAYED_RELEASE_TABLET | Freq: Every day | ORAL | Status: DC
  Administered 2018-10-06: 81 mg via ORAL
  Filled 2018-10-05 (×2): qty 1

## 2018-10-05 MED ORDER — TRAMADOL HCL 50 MG PO TABS: 50.0000 mg | ORAL_TABLET | Freq: Four times a day (QID) | ORAL | Status: DC | PRN

## 2018-10-05 MED ORDER — METOPROLOL TARTRATE 50 MG PO TABS
50.0000 mg | ORAL_TABLET | Freq: Every day | ORAL | Status: DC
  Administered 2018-10-05 – 2018-10-06 (×2): 50 mg via ORAL
  Filled 2018-10-05 (×2): qty 1

## 2018-10-05 MED ORDER — ENOXAPARIN SODIUM 40 MG/0.4ML ~~LOC~~ SOLN
40.0000 mg | SUBCUTANEOUS | Status: DC
  Administered 2018-10-05: 40 mg via SUBCUTANEOUS
  Filled 2018-10-05: qty 0.4

## 2018-10-05 MED ORDER — ALBUTEROL SULFATE (2.5 MG/3ML) 0.083% IN NEBU: 2.5000 mg | INHALATION_SOLUTION | RESPIRATORY_TRACT | Status: DC | PRN

## 2018-10-05 MED ORDER — ASPIRIN 81 MG PO CHEW
324.0000 mg | CHEWABLE_TABLET | Freq: Once | ORAL | Status: AC
  Administered 2018-10-05: 324 mg via ORAL

## 2018-10-05 MED ORDER — GABAPENTIN 300 MG PO CAPS
300.0000 mg | ORAL_CAPSULE | Freq: Three times a day (TID) | ORAL | Status: DC
  Administered 2018-10-05 – 2018-10-06 (×3): 300 mg via ORAL
  Filled 2018-10-05 (×5): qty 1

## 2018-10-05 MED ORDER — NITROGLYCERIN 0.4 MG SL SUBL: 0.4000 mg | SUBLINGUAL_TABLET | SUBLINGUAL | Status: DC | PRN

## 2018-10-05 MED ORDER — PEG 3350 17 GM/SCOOP PO POWD: 17.0000 g | Freq: Every day | ORAL | Status: DC

## 2018-10-05 MED ORDER — TAMSULOSIN HCL 0.4 MG PO CAPS
0.4000 mg | ORAL_CAPSULE | Freq: Every day | ORAL | Status: DC
  Administered 2018-10-05 – 2018-10-06 (×2): 0.4 mg via ORAL
  Filled 2018-10-05 (×2): qty 1

## 2018-10-05 MED ORDER — ATORVASTATIN CALCIUM 20 MG PO TABS
20.0000 mg | ORAL_TABLET | Freq: Every day | ORAL | Status: DC
  Administered 2018-10-05 – 2018-10-06 (×2): 20 mg via ORAL
  Filled 2018-10-05 (×2): qty 1

## 2018-10-05 MED ORDER — POLYETHYLENE GLYCOL 3350 17 G PO PACK
17.0000 g | PACK | Freq: Every day | ORAL | Status: DC | PRN
  Filled 2018-10-05: qty 1

## 2018-10-05 MED ORDER — BUMETANIDE 1 MG PO TABS
1.0000 mg | ORAL_TABLET | Freq: Two times a day (BID) | ORAL | Status: DC
  Administered 2018-10-06: 1 mg via ORAL
  Filled 2018-10-05 (×4): qty 1

## 2018-10-05 MED ORDER — ONDANSETRON HCL 4 MG/2ML IJ SOLN: 4.0000 mg | Freq: Four times a day (QID) | INTRAMUSCULAR | Status: DC | PRN

## 2018-10-05 NOTE — ED Triage Notes (Signed)
Patient c/o sharp chest pain this morning.  Patient states that he took a Nitroglycerin SL around 7 am this morning.  Patient denies SOB or difficulty breathing.  Patient reports history of CHF.  Patient reports that his previous cold symptoms have resolved.

## 2018-10-05 NOTE — Progress Notes (Signed)
   10/05/18 1100  Clinical Encounter Type  Visited With Patient;Health care provider  Visit Type Initial  Consult/Referral To Chaplain  Spiritual Encounters  Spiritual Needs Brochure  Stress Factors  Patient Stress Factors Health changes   Chaplain received a referral from the patient's nurse re: Advanced Directive completion. Upon arrival, the patient was laying in bed. He was awake and alert and confirmed interest in completing the document here. Chaplain provided education, learning that he is an [place] resident, but is here for work. 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 Chaplain informed the patient that he should inform his nurse when he is ready to finalize the document today.

## 2018-10-05 NOTE — Care Management Note (Signed)
Case Management Note  Patient Details  Name: SPIROS RUTIGLIANO MRN: [id number] Date of Birth: [birth year]  Subjective/Objective:      Patient is being placed in observation for chest pain that needs further workup with repeat troponin's, ECHO and cardiology consult.  Patient is from [place] here on a business trip.  Patient reports that he does have a brother in [place] but his children would be his next of kin.  Patient is independent in all ADL's and he drives.  Patient still works since he is in town on business.  Patient verifies he has a PCP in [place].  No discharge needs identified at this time. Robbie Lis RN BSN Care Manager [phone]               Action/Plan:   Expected Discharge Date:                  Expected Discharge Plan:  Home/Self Care  In-House Referral:     Discharge planning Services     Post Acute Care Choice:    Choice offered to:     DME Arranged:    DME Agency:     HH Arranged:    HH Agency:     Status of Service:  Completed, signed off  If discussed at Long Length of Stay Meetings, dates discussed:    Additional Comments:  Allayne Butcher, RN 10/05/2018, 2:37 PM

## 2018-10-05 NOTE — ED Notes (Signed)
Pt denies Cp/SOB/Dizziness at this time. NAd noted.

## 2018-10-05 NOTE — ED Provider Notes (Signed)
[facility]    CSN: [id number] Arrival date & time: 10/05/18  1552     History   Chief Complaint Chief Complaint  Patient presents with  . Chest Pain    HPI Gary Moore is a 63 y.o. male.   63 yo male with a h/o CAD, s/p NSTEMI years ago per outside records, s/p CABG in 2016 presents with a c/o "sharp chest pain" this morning around 7:30am. States he took some nitroglycerin and pain resolved, however now feeling "some discomfort" in his chest "but not pain", "just something doesn't feel right". Denies shortness of breath, jaw, neck or shoulder pain, nausea, vomiting or diaphoresis.      Chest Pain    Past Medical History:  Diagnosis Date  . Hypertension   . Thyroid disease     There are no active problems to display for this patient.   Past Surgical History:  Procedure Laterality Date  . CARDIAC SURGERY    . GASTRIC BYPASS    . REPLACEMENT TOTAL KNEE BILATERAL         Home Medications    Prior to Admission medications   Medication Sig Start Date End Date Taking? Authorizing Provider  allopurinol (ZYLOPRIM) 300 MG tablet Take 300 mg by mouth daily. 03/29/18  Yes [provider]  atorvastatin (LIPITOR) 20 MG tablet Take 20 mg by mouth daily. 07/16/18  Yes [provider]  bumetanide (BUMEX) 1 MG tablet Take 1 mg by mouth 2 (two) times daily. 05/29/18  Yes [provider]  clopidogrel (PLAVIX) 75 MG tablet Take 75 mg by mouth daily. 07/16/18  Yes [provider]  donepezil (ARICEPT) 10 MG tablet  09/11/18  Yes [provider]  furosemide (LASIX) 40 MG tablet Take 40 mg by mouth daily. 08/26/18  Yes [provider]  gabapentin (NEURONTIN) 300 MG capsule  08/05/18  Yes [provider]  isosorbide mononitrate (IMDUR) 30 MG 24 hr tablet Take 30 mg by mouth daily. 07/27/18  Yes [provider]  levothyroxine (SYNTHROID, LEVOTHROID) 75 MCG tablet TAKE 1 TABLET BY MOUTH ONCE DAILY EXCEPT  ON FRIDAYS TAKE ONLY A 1 2 TABLET 08/27/18  Yes [provider]  metoprolol tartrate (LOPRESSOR) 50 MG tablet  08/30/18  Yes [provider]  nitroGLYCERIN (NITROSTAT) 0.4 MG SL tablet DISSOLVE ONE TABLET UNDER THE TONGUE EVERY 5 MINUTES AS NEEDED FOR CHEST PAIN. DO NOT EXCEED A TOTAL OF 3 DOSES IN 15 MINUTES GO TO ER IF NO 06/15/18  Yes [provider]  traMADol (ULTRAM) 50 MG tablet Take 50 mg by mouth every 8 (eight) hours as needed. 08/27/18  Yes [provider]  chlorpheniramine-HYDROcodone (TUSSIONEX PENNKINETIC ER) 10-8 MG/5ML SUER Take 5 mLs by mouth at bedtime as needed. 09/17/18   Payton Mccallum, MD  chlorpheniramine-HYDROcodone (TUSSIONEX PENNKINETIC ER) 10-8 MG/5ML SUER Take 5 mLs by mouth at bedtime as needed. 09/21/18   Tommie Sams, DO  doxycycline (VIBRA-TABS) 100 MG tablet Take 1 tablet (100 mg total) by mouth 2 (two) times daily. 09/17/18   Payton Mccallum, MD  fluticasone Aleda Grana) 50 MCG/ACT nasal spray  08/13/18   [provider]  predniSONE (DELTASONE) 20 MG tablet Take 1 tablet (20 mg total) by mouth daily. 09/17/18   Payton Mccallum, MD    Family History Family History  Problem Relation Age of Onset  . Hypertension Mother   . Diabetes Mother   . Hypertension Father     Social History Social History  Tobacco Use  . Smoking status: Former Games developer  . Smokeless tobacco: Never Used  Substance Use Topics  . Alcohol use: Yes  . Drug use: Never     Allergies   Patient has no known allergies.   Review of Systems Review of Systems  Cardiovascular: Positive for chest pain.     Physical Exam Triage Vital Signs ED Triage Vitals  Enc Vitals Group     BP 10/05/18 0845 118/66     Pulse Rate 10/05/18 0845 70     Resp 10/05/18 0845 16     Temp 10/05/18 0845 98.2 F (36.8 C)     Temp Source 10/05/18 0845 Oral     SpO2 10/05/18 0845 98 %     Weight 10/05/18 0843 255 lb (115.7 kg)     Height 10/05/18 0843 5\' 9"  (1.753 m)      Head Circumference --      Peak Flow --      Pain Score 10/05/18 0843 0     Pain Loc --      Pain Edu? --      Excl. in GC? --    No data found.  Updated Vital Signs BP 118/66 (BP Location: Left Arm)   Pulse 70   Temp 98.2 F (36.8 C) (Oral)   Resp 16   Ht 5\' 9"  (1.753 m)   Wt 115.7 kg   SpO2 98%   BMI 37.66 kg/m   Visual Acuity Right Eye Distance:   Left Eye Distance:   Bilateral Distance:    Right Eye Near:   Left Eye Near:    Bilateral Near:     Physical Exam Vitals signs and nursing note reviewed.  Constitutional:      General: He is not in acute distress.    Appearance: He is not toxic-appearing or diaphoretic.  Cardiovascular:     Rate and Rhythm: Normal rate and regular rhythm.     Pulses: Normal pulses.     Heart sounds: Normal heart sounds.  Pulmonary:     Effort: Pulmonary effort is normal. No respiratory distress.     Breath sounds: No stridor. Rales (bases) present. No wheezing or rhonchi.  Neurological:     Mental Status: He is alert.      UC Treatments / Results  Labs (all labs ordered are listed, but only abnormal results are displayed) Labs Reviewed - No data to display  EKG None  Radiology No results found.  Procedures ED EKG Date/Time: 10/05/2018 9:37 AM Performed by: Payton Mccallum, MD Authorized by: Payton Mccallum, MD   ECG reviewed by ED Physician in the absence of a cardiologist: yes   Previous ECG:    Previous ECG:  Unavailable Interpretation:    Interpretation: abnormal   Rate:    ECG rate:  65   ECG rate assessment: normal   Rhythm:    Rhythm: sinus rhythm   Ectopy:    Ectopy: none   QRS:    QRS axis:  Normal   QRS intervals:  Normal Conduction:    Conduction: normal   ST segments:    ST segments:  Normal T waves:    T waves: inverted     Inverted:  V1, V2 and V3 Q waves:    Q waves:  V1, V2 and V3   (including critical care time)  Medications Ordered in UC Medications  aspirin chewable tablet 324 mg  (324 mg Oral Given 10/05/18 0916)    Initial Impression /  Assessment and Plan / UC Course  I have reviewed the triage vital signs and the nursing notes.  Pertinent labs & imaging results that were available during my care of the patient were reviewed by me and considered in my medical decision making (see chart for details).      Final Clinical Impressions(s) / UC Diagnoses   Final diagnoses:  Atypical chest pain    ED Prescriptions    None      1. ekg results and possible diagnoses reviewed with patient; recommend patient go to ED by EMS for further evaluation and management; given 4 ASA; IV saline lock; patient in stable condition; report called to charge RN at [facility] ED.    Controlled Substance Prescriptions [place] Controlled Substance Registry consulted? Not Applicable   Payton Mccallum, MD 10/05/18 [phone]

## 2018-10-05 NOTE — H&P (Signed)
[facility] [facility] [place] [facility]   PATIENT NAME: Gary Moore    MR#:  [id number]  DATE OF BIRTH:  1956-06-03  DATE OF ADMISSION:  10/05/2018  PRIMARY CARE PHYSICIAN: Patient, No Pcp Per   REQUESTING/REFERRING PHYSICIAN: Dr. Alphonzo Lemmings  CHIEF COMPLAINT:   Chief Complaint  Patient presents with  . Chest Pain    HISTORY OF PRESENT ILLNESS:  Axeton Mure  is a 63 y.o. male with a known history of pretension, CAD, obesity, diastolic congestive heart failure presents to the emergency room due to chest pain relieved with nitro over the last 2 days.  He had similar pain 6 months back.  Which resolved. Patient has complicated cardiac history with CABG in the past.  His last PCI was 2 years back according to him in [place]. He is also felt extremely lethargic and tired over the last 2 days since the pain started.  He feels he is been retaining more fluid Presently he is chest pain-free.  Due to high risk and chest pain resolved with nitro patient is being admitted under observation for cardiology evaluation and further work-up.  The patient underwent bypass grafting at an outside facility. His ejection fraction was 55%. H~e had an 80 %left anterior descending stenosis, 80 % diagonal stenosis, 80 5 stenosis of an acute or obtuse marginal branch which came off either the circumflex or the right, and a significant stenosis in the P~DA which came off the left circumflex. The right coronary was occluded and nondominant. H~e had an occluded 2nd obtuse marginal branch. Plan was for bypass of the left anterior descending, diagonal, acute marginal or obtuse marginal, and the P~DA~. Only a lima to the left anterior descending was placed. They could not harvestany veins. ~The surgery was evidently not completed. The patient had respiratory failure the next day. He was transported to [facility] and cared for on the [facility] service. H~e was eventually extubated. H~is ejection fraction was 55%. Marland Kitchen    PAST MEDICAL HISTORY:   Past Medical History:  Diagnosis Date  . Hypertension   . Thyroid disease     PAST SURGICAL HISTORY:   Past Surgical History:  Procedure Laterality Date  . CARDIAC SURGERY    . GASTRIC BYPASS    . REPLACEMENT TOTAL KNEE BILATERAL      SOCIAL HISTORY:   Social History   Tobacco Use  . Smoking status: Former Games developer  . Smokeless tobacco: Never Used  Substance Use Topics  . Alcohol use: Yes    FAMILY HISTORY:   Family History  Problem Relation Age of Onset  . Hypertension Mother   . Diabetes Mother   . Hypertension Father     DRUG ALLERGIES:  No Known Allergies  REVIEW OF SYSTEMS:   Review of Systems  Constitutional: Positive for malaise/fatigue. Negative for chills and fever.  HENT: Negative for sore throat.   Eyes: Negative for blurred vision, double vision and pain.  Respiratory: Negative for cough, hemoptysis, shortness of breath and wheezing.   Cardiovascular: Positive for chest pain. Negative for palpitations, orthopnea and leg swelling.  Gastrointestinal: Negative for abdominal pain, constipation, diarrhea, heartburn, nausea and vomiting.  Genitourinary: Negative for dysuria and hematuria.  Musculoskeletal: Negative for back pain and joint pain.  Skin: Negative for rash.  Neurological: Negative for sensory change, speech change, focal weakness and headaches.  Endo/Heme/Allergies: Does not bruise/bleed easily.  Psychiatric/Behavioral: Negative for depression. The patient is not nervous/anxious.     MEDICATIONS AT HOME:   Prior  to Admission medications   Medication Sig Start Date End Date Taking? Authorizing Provider  allopurinol (ZYLOPRIM) 300 MG tablet Take 300 mg by mouth daily. 03/29/18   [provider]  atorvastatin (LIPITOR) 20 MG tablet Take 20 mg by mouth daily. 07/16/18   [provider]  bumetanide (BUMEX) 1 MG tablet Take 1 mg by mouth 2 (two) times daily. 05/29/18   [provider]   chlorpheniramine-HYDROcodone (TUSSIONEX PENNKINETIC ER) 10-8 MG/5ML SUER Take 5 mLs by mouth at bedtime as needed. 09/17/18   Payton Mccallum, MD  chlorpheniramine-HYDROcodone (TUSSIONEX PENNKINETIC ER) 10-8 MG/5ML SUER Take 5 mLs by mouth at bedtime as needed. 09/21/18   Tommie Sams, DO  clopidogrel (PLAVIX) 75 MG tablet Take 75 mg by mouth daily. 07/16/18   [provider]  donepezil (ARICEPT) 10 MG tablet  09/11/18   [provider]  doxycycline (VIBRA-TABS) 100 MG tablet Take 1 tablet (100 mg total) by mouth 2 (two) times daily. 09/17/18   Payton Mccallum, MD  fluticasone Aleda Grana) 50 MCG/ACT nasal spray  08/13/18   [provider]  furosemide (LASIX) 40 MG tablet Take 40 mg by mouth daily. 08/26/18   [provider]  gabapentin (NEURONTIN) 300 MG capsule  08/05/18   [provider]  isosorbide mononitrate (IMDUR) 30 MG 24 hr tablet Take 30 mg by mouth daily. 07/27/18   [provider]  levothyroxine (SYNTHROID, LEVOTHROID) 75 MCG tablet TAKE 1 TABLET BY MOUTH ONCE DAILY EXCEPT ON FRIDAYS TAKE ONLY A 1 2 TABLET 08/27/18   [provider]  metoprolol tartrate (LOPRESSOR) 50 MG tablet  08/30/18   [provider]  nitroGLYCERIN (NITROSTAT) 0.4 MG SL tablet DISSOLVE ONE TABLET UNDER THE TONGUE EVERY 5 MINUTES AS NEEDED FOR CHEST PAIN. DO NOT EXCEED A TOTAL OF 3 DOSES IN 15 MINUTES GO TO ER IF NO 06/15/18   [provider]  predniSONE (DELTASONE) 20 MG tablet Take 1 tablet (20 mg total) by mouth daily. 09/17/18   Payton Mccallum, MD  traMADol (ULTRAM) 50 MG tablet Take 50 mg by mouth every 8 (eight) hours as needed. 08/27/18   [provider]     VITAL SIGNS:  Blood pressure 120/63, pulse 66, temperature (!) 97.5 F (36.4 C), temperature source Oral, resp. rate 17, height  (1.753 m), weight 115 kg, SpO2 99 %.  PHYSICAL EXAMINATION:  Physical Exam  GENERAL:  63 y.o.-year-old patient lying in the be.  Obese EYES:  Pupils equal, round, reactive to light and accommodation. No scleral icterus. Extraocular muscles intact.  HEENT: Head atraumatic, normocephalic. Oropharynx and nasopharynx clear. No oropharyngeal erythema, moist oral mucosa  NECK:  Supple, no jugular venous distention. No thyroid enlargement, no tenderness.  LUNGS: Normal breath sounds bilaterally, no wheezing, rales, rhonchi. No use of accessory muscles of respiration.  CARDIOVASCULAR: S1, S2 normal. No murmurs, rubs, or gallops.  ABDOMEN: Soft, nontender, nondistended. Bowel sounds present. No organomegaly or mass.  EXTREMITIES: No pedal edema, cyanosis, or clubbing. + 2 pedal & radial pulses b/l.   NEUROLOGIC: Cranial nerves II through XII are intact. No focal Motor or sensory deficits appreciated b/l PSYCHIATRIC: The patient is alert and oriented x 3. Good affect.  SKIN: No obvious rash, lesion, or ulcer.   LABORATORY PANEL:   CBC Recent Labs  Lab 10/05/18 0958  WBC 8.3  HGB 13.7  HCT 42.5  PLT 251   ------------------------------------------------------------------------------------------------------------------  Chemistries  Recent Labs  Lab 10/05/18 0958  NA 139  K  3.8  CL 101  CO2 26  GLUCOSE 81  BUN 14  CREATININE 1.06  CALCIUM 8.7*  AST 49*  ALT 34  ALKPHOS 105  BILITOT 0.6   ------------------------------------------------------------------------------------------------------------------  Cardiac Enzymes Recent Labs  Lab 10/05/18 0958  TROPONINI <0.03   ------------------------------------------------------------------------------------------------------------------  RADIOLOGY:  Dg Chest 2 View  Result Date: 10/05/2018 CLINICAL DATA:  Chest pain. EXAM: CHEST - 2 VIEW COMPARISON:  None. FINDINGS: Mild cardiomegaly status post CABG. Normal mediastinal contours. Normal pulmonary vascularity. Linear atelectasis/scarring at the left lung base. No focal consolidation, pleural effusion, or pneumothorax. No  acute osseous abnormality. IMPRESSION: No active cardiopulmonary disease. Electronically Signed   By: Obie Dredge M.D.   On: 10/05/2018 10:28     IMPRESSION AND PLAN:   *Chest pain resolved with nitro in a patient with history of CAD, CABG.  Initial troponin is normal.  Patient will be admitted under observation to telemetry floor.  Repeat troponin.  Nitro as needed.  Aspirin.  Will consult cardiology.  Sent message to Dr. call Lucretia Roers.  Consult added. Further work-up per cardiology recommendations.  I ordered an echocardiogram.  *Hypertension.  Continue medication  *Chronic diastolic CHF.  No signs of fluid overload.  Continue Bumex.  Wait for echocardiogram.  *Sleep apnea.  CPAP at night.  *DVT prophylaxis with Lovenox  All the records are reviewed and case discussed with ED provider. Management plans discussed with the patient, family and they are in agreement.  CODE STATUS: Full code  TOTAL TIME TAKING CARE OF THIS PATIENT: 35 minutes.   Molinda Bailiff Febe Champa M.D on 10/05/2018 at 12:35 PM  Between 7am to 6pm - Pager - [phone]  After 6pm go to [email or web address] - password [email or web address]  [facility]  Office  [phone]  CC: Primary care physician; Patient, No Pcp Per  Note: This dictation was prepared with Dragon dictation along with smaller phrase technology. Any transcriptional errors that result from this process are unintentional.

## 2018-10-05 NOTE — ED Provider Notes (Addendum)
[facility] Emergency Department Provider Note  ____________________________________________   I have reviewed the triage vital signs and the nursing notes. Where available I have reviewed prior notes and, if possible and indicated, outside hospital notes.    HISTORY  Chief Complaint Chest Pain    HPI Gary Moore is a 63 y.o. male resents today complaining of left-sided chest pain, does not radiate, began this morning gradually, took aspirin and nitro and it went away, has had several significant heart problems in the past.  He was actually scheduled for a bypass several years ago, and it was unable to be completed because of poor vein grafting.  There is a note in the care everywhere from [place] which gives excellent description of his anatomy.  I will were present here.   The patient underwent bypass grafting at an outside facility. His ejection fraction was 55%. H~e had an 80 %left anterior descending stenosis, 80 % diagonal stenosis, 80 5 stenosis of an acute or obtuse marginal branch which came off either the circumflex or the right, and a significant stenosis in the P~DA which came off the left circumflex. The right coronary was occluded and nondominant. H~e had an occluded 2nd obtuse marginal branch. Plan was for bypass of the left anterior descending, diagonal, acute marginal or obtuse marginal, and the P~DA~. Only a lima to the left anterior descending was placed. They could not harvestany veins. ~The surgery was evidently not completed. The patient had respiratory failure the next day. He was transported to [facility] and cared for on the [facility] service. H~e was eventually extubated. H~is ejection fraction was 55%. .   And states that the pain he had when he had his heart attack was slightly different that this but this is the pain he has been having since and he takes nitroglycerin for it and it goes away last time he took nitroglycerin was 6 months ago.  It  was not maximal intensity at onset and did not radiate to his back not pleuritic no shortness of breath no personal family history of PE or DVT he is compliant with his medications. He states nitroglycerin made it go away.  He believes it to be heart pain.  No recent exertional issues, no orthopnea, no other pain, it was a sharp pain.  No other prior interventions or other complaint  Past Medical History:  Diagnosis Date  . Hypertension   . Thyroid disease     There are no active problems to display for this patient.   Past Surgical History:  Procedure Laterality Date  . CARDIAC SURGERY    . GASTRIC BYPASS    . REPLACEMENT TOTAL KNEE BILATERAL      Prior to Admission medications   Medication Sig Start Date End Date Taking? Authorizing Provider  allopurinol (ZYLOPRIM) 300 MG tablet Take 300 mg by mouth daily. 03/29/18   [provider]  atorvastatin (LIPITOR) 20 MG tablet Take 20 mg by mouth daily. 07/16/18   [provider]  bumetanide (BUMEX) 1 MG tablet Take 1 mg by mouth 2 (two) times daily. 05/29/18   [provider]  chlorpheniramine-HYDROcodone (TUSSIONEX PENNKINETIC ER) 10-8 MG/5ML SUER Take 5 mLs by mouth at bedtime as needed. 09/17/18   Payton Mccallum, MD  chlorpheniramine-HYDROcodone (TUSSIONEX PENNKINETIC ER) 10-8 MG/5ML SUER Take 5 mLs by mouth at bedtime as needed. 09/21/18   Tommie Sams, DO  clopidogrel (PLAVIX) 75 MG tablet Take 75 mg by mouth daily. 07/16/18   [provider]  donepezil (ARICEPT) 10 MG tablet  09/11/18   [provider]  doxycycline (VIBRA-TABS) 100 MG tablet Take 1 tablet (100 mg total) by mouth 2 (two) times daily. 09/17/18   Payton Mccallum, MD  fluticasone Aleda Grana) 50 MCG/ACT nasal spray  08/13/18   [provider]  furosemide (LASIX) 40 MG tablet Take 40 mg by mouth daily. 08/26/18   [provider]  gabapentin (NEURONTIN) 300 MG capsule  08/05/18   [provider]  isosorbide  mononitrate (IMDUR) 30 MG 24 hr tablet Take 30 mg by mouth daily. 07/27/18   [provider]  levothyroxine (SYNTHROID, LEVOTHROID) 75 MCG tablet TAKE 1 TABLET BY MOUTH ONCE DAILY EXCEPT ON FRIDAYS TAKE ONLY A 1 2 TABLET 08/27/18   [provider]  metoprolol tartrate (LOPRESSOR) 50 MG tablet  08/30/18   [provider]  nitroGLYCERIN (NITROSTAT) 0.4 MG SL tablet DISSOLVE ONE TABLET UNDER THE TONGUE EVERY 5 MINUTES AS NEEDED FOR CHEST PAIN. DO NOT EXCEED A TOTAL OF 3 DOSES IN 15 MINUTES GO TO ER IF NO 06/15/18   [provider]  predniSONE (DELTASONE) 20 MG tablet Take 1 tablet (20 mg total) by mouth daily. 09/17/18   Payton Mccallum, MD  traMADol (ULTRAM) 50 MG tablet Take 50 mg by mouth every 8 (eight) hours as needed. 08/27/18   [provider]    Allergies Patient has no known allergies.  Family History  Problem Relation Age of Onset  . Hypertension Mother   . Diabetes Mother   . Hypertension Father     Social History Social History   Tobacco Use  . Smoking status: Former Games developer  . Smokeless tobacco: Never Used  Substance Use Topics  . Alcohol use: Yes  . Drug use: Never    Review of Systems Constitutional: No fever/chills Eyes: No visual changes. ENT: No sore throat. No stiff neck no neck pain Cardiovascular: Denies chest pain. Respiratory: Denies shortness of breath. Gastrointestinal:   no vomiting.  No diarrhea.  No constipation. Genitourinary: Negative for dysuria. Musculoskeletal: Negative lower extremity swelling Skin: Negative for rash. Neurological: Negative for severe headaches, focal weakness or numbness.   ____________________________________________   PHYSICAL EXAM:  VITAL SIGNS: ED Triage Vitals [10/05/18 0956]  Enc Vitals Group     BP 130/68     Pulse Rate 60     Resp 18     Temp (!) 97.5 F (36.4 C)     Temp Source Oral     SpO2 98 %     Weight      Height      Head Circumference      Peak Flow       Pain Score      Pain Loc      Pain Edu?      Excl. in GC?     Constitutional: Alert and oriented. Well appearing and in no acute distress. Eyes: Conjunctivae are normal Head: Atraumatic HEENT: No congestion/rhinnorhea. Mucous membranes are moist.  Oropharynx non-erythematous Neck:   Nontender with no meningismus, no masses, no stridor Cardiovascular: Normal rate, regular rhythm. Grossly normal heart sounds.  Good peripheral circulation. Respiratory: Normal respiratory effort.  No retractions. Lungs CTAB. Abdominal: Soft and nontender. No distention. No guarding no rebound Back:  There is no focal tenderness or step off.  there is no midline tenderness there are no lesions noted. there is no CVA tenderness Musculoskeletal: No lower extremity tenderness, no upper extremity tenderness. No joint effusions,  no DVT signs strong distal pulses no edema Neurologic:  Normal speech and language. No gross focal neurologic deficits are appreciated.  Skin:  Skin is warm, dry and intact. No rash noted. Psychiatric: Mood and affect are normal. Speech and behavior are normal.  ____________________________________________   LABS (all labs ordered are listed, but only abnormal results are displayed)  Labs Reviewed  CBC  BASIC METABOLIC PANEL  TROPONIN I  HEPATIC FUNCTION PANEL  LIPASE, BLOOD  BRAIN NATRIURETIC PEPTIDE  APTT  PROTIME-INR    Pertinent labs  results that were available during my care of the patient were reviewed by me and considered in my medical decision making (see chart for details). ____________________________________________  EKG  I personally interpreted any EKGs ordered by me or triage EKG shows sinus rhythm rate 68 bpm, normal axis no acute ST elevation or depression ____________________________________________  RADIOLOGY  Pertinent labs & imaging results that were available during my care of the patient were reviewed by me and considered in my medical decision  making (see chart for details). If possible, patient and/or family made aware of any abnormal findings.  Dg Chest 2 View  Result Date: 10/05/2018 CLINICAL DATA:  Chest pain. EXAM: CHEST - 2 VIEW COMPARISON:  None. FINDINGS: Mild cardiomegaly status post CABG. Normal mediastinal contours. Normal pulmonary vascularity. Linear atelectasis/scarring at the left lung base. No focal consolidation, pleural effusion, or pneumothorax. No acute osseous abnormality. IMPRESSION: No active cardiopulmonary disease. Electronically Signed   By: Obie Dredge M.D.   On: 10/05/2018 10:28   ____________________________________________    PROCEDURES  Procedure(s) performed: None  Procedures  Critical Care performed: CRITICAL CARE Performed by: Jeanmarie Plant   Total critical care time: 38 minutes  Critical care time was exclusive of separately billable procedures and treating other patients.  Critical care was necessary to treat or prevent imminent or life-threatening deterioration.  Critical care was time spent personally by me on the following activities: development of treatment plan with patient and/or surrogate as well as nursing, discussions with consultants, evaluation of patient's response to treatment, examination of patient, obtaining history from patient or surrogate, ordering and performing treatments and interventions, ordering and review of laboratory studies, ordering and review of radiographic studies, pulse oximetry and re-evaluation of patient's condition.   ____________________________________________   INITIAL IMPRESSION / ASSESSMENT AND PLAN / ED COURSE  Pertinent labs & imaging results that were available during my care of the patient were reviewed by me and considered in my medical decision making (see chart for details).  Here with chest pain this morning lasted for a few hours went away with nitroglycerin has had aspirin, nothing to suggest PE or dissection clinically, this  is similar pain that he has had to use nitroglycerin for in the past.  He has a very complicated cardiac history and a complicated cardiac anatomy with prior stents and failed CABG.  Given this history, he likely will need to be admitted for further evaluation.    ____________________________________________   FINAL CLINICAL IMPRESSION(S) / ED DIAGNOSES  Final diagnoses:  None      This chart was dictated using voice recognition software.  Despite best efforts to proofread,  errors can occur which can change meaning.      Jeanmarie Plant, MD 10/05/18 1041    Jeanmarie Plant, MD 10/15/18 [phone]

## 2018-10-05 NOTE — ED Notes (Signed)
Pt states, CP "in my heart" that started this morning at 10am, "took some nitro and went to urgent care and it went away" "I have a history of CHF and a heart attack". Pt states not been able to sleep  "well" due to tiredness and "not feeling well". Pt present to ED afebrile and in NAD. This Rn will continue to monitor pt.

## 2018-10-05 NOTE — ED Triage Notes (Signed)
PT arrived to Ed via [facility] from [facility] urgent Care . Per EMS pt c/o CP, feeling lethargic, "tired", pt denies SOB and headache, 3 aspirins and nitro  given at urgent care. Per EMS; bp 130/70; HR 72; SpO2 97%;RR20. NAD noted at this time.

## 2018-10-05 NOTE — Progress Notes (Signed)
Advance care planning  Purpose of Encounter Chest pain, CAD  Parties in Attendance Patient  Patients Decisional capacity Alert and oriented.  Able to make medical decisions  Does not have documented healthcare power of attorney.  Wants his son Ayman Vicary to make decisions if he is unable to.  Encouraged to finish documentation.  Patient has just filled out paperwork to be notarized.  Discussed with patient regarding his chest pain and need for further work-up and admission.  All questions answered.  CODE STATUS discussed.  Patient mentions that he would never want to be in a nursing home or sustained on artificial means.  But would like attempt at resuscitation and intubation if needed.  He has been in the ICU in the past and was able to be extubated.  Wishes to be full code.  CODE STATUS orders entered.  Changed.  Full code.  Time spent - 17 minutes

## 2018-10-05 NOTE — ED Notes (Signed)
Cardiology here to see pt.  Pt is sitting up in chair next to bed per pt request.  Pt given dinner tray but pt declined this.  Brought him some juice.  Pt is in no distress at this time.

## 2018-10-05 NOTE — Care Management Obs Status (Signed)
MEDICARE OBSERVATION STATUS NOTIFICATION   Patient Details  Name: ZAK HYKE MRN: [id number] Date of Birth: May 13, 1956   Medicare Observation Status Notification Given:  Yes    Allayne Butcher, RN 10/05/2018, 2:34 PM

## 2018-10-05 NOTE — Consult Note (Signed)
Reason for Consult: Unstable angina chest pain known coronary disease Referring Physician: Dr. Elpidio Anis hospitalist  Gary Moore is an 63 y.o. male.  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  Unable to exercise because of arthritic trouble initially  Past Medical History:  Diagnosis Date  . Hypertension   . Thyroid disease     Past Surgical History:  Procedure Laterality Date  . CARDIAC SURGERY    . GASTRIC BYPASS    . REPLACEMENT TOTAL KNEE BILATERAL      Family History  Problem Relation Age of Onset  . Hypertension Mother   . Diabetes Mother   . Hypertension Father     Social History:  reports that he has quit smoking. He has never used smokeless tobacco. He reports current alcohol use. He reports that he does not use drugs.  Allergies:  Allergies  Allergen Reactions  . Codeine Other (See Comments)    Causes hiccups for at least one week    Medications: I have reviewed the patient's current medications.  Results for orders placed or performed during the hospital encounter of 10/05/18 (from the past 48 hour(s))  Basic metabolic panel     Status: Abnormal   Collection Time: 10/05/18  9:58 AM  Result Value Ref Range   Sodium 139 135 - 145 mmol/L   Potassium 3.8 3.5 - 5.1 mmol/L    Chloride 101 98 - 111 mmol/L   CO2 26 22 - 32 mmol/L   Glucose, Bld 81 70 - 99 mg/dL   BUN 14 8 - 23 mg/dL   Creatinine, Ser 4.58 0.61 - 1.24 mg/dL   Calcium 8.7 (L) 8.9 - 10.3 mg/dL   GFR calc non Af Amer >60 >60 mL/min   GFR calc Af Amer >60 >60 mL/min   Anion gap 12 5 - 15    Comment: Performed at [facility], [address] [place], [place] [address]  CBC     Status: None   Collection Time: 10/05/18  9:58 AM  Result Value Ref Range   WBC 8.3 4.0 - 10.5 K/uL   RBC 4.65 4.22 - 5.81 MIL/uL   Hemoglobin 13.7 13.0 - 17.0 g/dL   HCT 38.2 50.5 - 39.7 %   MCV 91.4 80.0 - 100.0 fL   MCH 29.5 26.0 - 34.0 pg   MCHC 32.2 30.0 - 36.0 g/dL   RDW 67.3 41.9 - 37.9 %   Platelets 251 150 - 400 K/uL   nRBC 0.0 0.0 - 0.2 %    Comment: Performed at [facility], [address] [place], [place] [address]  Troponin I - ONCE - STAT     Status: None   Collection Time: 10/05/18  9:58 AM  Result Value Ref Range   Troponin I <0.03 <0.03 ng/mL    Comment: Performed at [facility], [address] [place], [place] [address]  Hepatic function panel     Status: Abnormal   Collection Time: 10/05/18  9:58 AM  Result Value Ref Range   Total Protein 7.0 6.5 - 8.1 g/dL   Albumin 4.0 3.5 - 5.0 g/dL   AST 49 (H) 15 - 41 U/L   ALT 34 0 - 44 U/L   Alkaline Phosphatase 105 38 - 126 U/L   Total Bilirubin 0.6 0.3 - 1.2 mg/dL   Bilirubin, Direct 0.2 0.0 - 0.2 mg/dL   Indirect Bilirubin 0.4 0.3 - 0.9 mg/dL    Comment: Performed at [facility], [address] [place], [place] [address]  Lipase, blood     Status: None   Collection Time: 10/05/18  9:58 AM  Result Value Ref Range   Lipase 35 11 - 51 U/L    Comment: Performed at [facility], [address] [place], [place] [address]  Brain natriuretic peptide     Status: Abnormal   Collection Time: 10/05/18 10:05 AM  Result Value Ref Range   B Natriuretic Peptide 108.0 (H) 0.0 - 100.0 pg/mL    Comment:  Performed at [facility], [address] [place], [place] [address]  Protime-INR     Status: None   Collection Time: 10/05/18 11:17 AM  Result Value Ref Range   Prothrombin Time 13.0 11.4 - 15.2 seconds   INR 1.0 0.8 - 1.2    Comment: (NOTE) INR goal varies based on device and disease states. Performed at [facility], [address] [place], [place] [address]   APTT     Status: None   Collection Time: 10/05/18 11:17 AM  Result Value Ref Range   aPTT 29 24 - 36 seconds    Comment: Performed at [facility], [address] [place], [place] [address]    Dg Chest 2 View  Result Date: 10/05/2018 CLINICAL DATA:  Chest pain. EXAM: CHEST - 2 VIEW COMPARISON:  None. FINDINGS: Mild cardiomegaly status post CABG. Normal mediastinal contours. Normal pulmonary vascularity. Linear atelectasis/scarring at the left lung base. No focal consolidation, pleural effusion, or pneumothorax. No acute osseous abnormality. IMPRESSION: No active cardiopulmonary disease. Electronically Signed   By: Obie Dredge M.D.   On: 10/05/2018 10:28    Review of Systems  Constitutional: Positive for malaise/fatigue.  HENT: Negative.   Eyes: Negative.   Respiratory: Positive for shortness of breath.   Cardiovascular: Positive for chest pain, leg swelling and PND.  Gastrointestinal: Positive for heartburn.  Genitourinary: Negative.   Musculoskeletal: Negative.   Skin: Negative.   Neurological: Negative.   Endo/Heme/Allergies: Negative.   Psychiatric/Behavioral: Negative.    Blood pressure (!) 132/92, pulse 65, temperature (!) 97.5 F (36.4 C), temperature source Oral, resp. rate 17, height 5\' 9"  (1.753 m), weight 115 kg, SpO2 98 %. Physical Exam  Nursing note and vitals reviewed. Constitutional: He is oriented to person, place, and time. He appears well-developed and well-nourished.  HENT:  Head: Normocephalic and atraumatic.  Eyes: Pupils are equal, round, and reactive to  light. Conjunctivae and EOM are normal.  Neck: Normal range of motion. Neck supple.  Cardiovascular: Normal rate, regular rhythm and normal heart sounds.  Respiratory: Effort normal and breath sounds normal.  GI: Soft.  Musculoskeletal: Normal range of motion.  Neurological: He is alert and oriented to person, place, and time. He has normal reflexes.  Skin: Skin is warm and dry.  Psychiatric: He has a normal  mood and affect.    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 Plan Agree with admit rule out for myocardial infarction Follow-up cardiac enzymes and EKGs Consider echocardiogram for further assessment evaluation Recommend functional study chemically for further assessment Continue medical therapy in the interim  Dwayne D Callwood 10/05/2018, 10:35 PM

## 2018-10-05 NOTE — Progress Notes (Signed)
   10/05/18 1600  Clinical Encounter Type  Visited With Patient  Visit Type Follow-up  Referral From Nurse   Chaplain followed up with the patient regarding the completion of his AD documentation. Notary and witnesses reserved; documentation finalized. Hardcopy given to the patient and a scanned version will be included in the patient's record. Patient expressed gratitude and will ask the nurse to call or page the chaplain if necessary.

## 2018-10-05 NOTE — ED Notes (Signed)
Blue top tube recollected and sent to lab 

## 2018-10-06 ENCOUNTER — Observation Stay: Payer: Medicare Other

## 2018-10-06 LAB — CBC
HCT: 40 % (ref 39.0–52.0)
Hemoglobin: 12.6 g/dL — ABNORMAL LOW (ref 13.0–17.0)
MCH: 29.1 pg (ref 26.0–34.0)
MCHC: 31.5 g/dL (ref 30.0–36.0)
MCV: 92.4 fL (ref 80.0–100.0)
Platelets: 234 10*3/uL (ref 150–400)
RBC: 4.33 MIL/uL (ref 4.22–5.81)
RDW: 15.1 % (ref 11.5–15.5)
WBC: 5.4 10*3/uL (ref 4.0–10.5)
nRBC: 0 % (ref 0.0–0.2)

## 2018-10-06 LAB — BASIC METABOLIC PANEL
Anion gap: 8 (ref 5–15)
BUN: 14 mg/dL (ref 8–23)
CALCIUM: 8.8 mg/dL — AB (ref 8.9–10.3)
CO2: 27 mmol/L (ref 22–32)
Chloride: 104 mmol/L (ref 98–111)
Creatinine, Ser: 0.89 mg/dL (ref 0.61–1.24)
GFR calc Af Amer: >60 mL/min (ref >60)
GFR calc non Af Amer: >60 mL/min (ref >60)
Glucose, Bld: 88 mg/dL (ref 70–99)
POTASSIUM: 3.9 mmol/L (ref 3.5–5.1)
Sodium: 139 mmol/L (ref 135–145)

## 2018-10-06 LAB — NM MYOCAR MULTI W/SPECT W/WALL MOTION / EF
Estimated workload: 1 METS
Exercise duration (min): 1 min
Exercise duration (sec): 9 s
LV dias vol: 93 mL (ref 62–150)
LV sys vol: 36 mL
Peak HR: 85 {beats}/min
Rest HR: 72 {beats}/min
SDS: 1
SRS: 14
SSS: 11
TID: 1.04

## 2018-10-06 LAB — HIV ANTIBODY (ROUTINE TESTING W REFLEX): HIV Screen 4th Generation wRfx: NONREACTIVE

## 2018-10-06 MED ORDER — REGADENOSON 0.4 MG/5ML IV SOLN
0.4000 mg | Freq: Once | INTRAVENOUS | Status: AC
  Administered 2018-10-06: 0.4 mg via INTRAVENOUS

## 2018-10-06 MED ORDER — TECHNETIUM TC 99M TETROFOSMIN IV KIT
30.0000 | PACK | Freq: Once | INTRAVENOUS | Status: AC | PRN
  Administered 2018-10-06: 28.76 via INTRAVENOUS
  Filled 2018-10-06: qty 30

## 2018-10-06 MED ORDER — TECHNETIUM TC 99M TETROFOSMIN IV KIT
10.0000 | PACK | Freq: Once | INTRAVENOUS | Status: AC | PRN
  Administered 2018-10-06: 11.64 via INTRAVENOUS
  Filled 2018-10-06: qty 10

## 2018-10-06 NOTE — Discharge Summary (Signed)
[facility] [place] NAME: Gary Moore    MR#:  [id number]  DATE OF BIRTH:  [birth year]  DATE OF ADMISSION:  10/05/2018 ADMITTING PHYSICIAN: Gary admitting provider for Gary Moore encounter.  DATE OF DISCHARGE: 10/07/2018   PRIMARY CARE PHYSICIAN: Gary Moore, Gary Moore    ADMISSION DIAGNOSIS:  chest discomfort ems  DISCHARGE DIAGNOSIS:  Active Problems:   Angina at rest [facility])   SECONDARY DIAGNOSIS:   Past Medical History:  Diagnosis Date  . Hypertension   . Thyroid disease     HOSPITAL COURSE:   Had hx of CAD, came with chest pain, kept on cardiac monitor, monitored troponin. Cardiologist saw him, suggested Stress test- which was negative. So discharge home.  DISCHARGE CONDITIONS:   Stable.  CONSULTS OBTAINED:    DRUG ALLERGIES:   Allergies  Allergen Reactions  . Codeine Other (See Comments)    Causes hiccups for at least one week    DISCHARGE MEDICATIONS:   Allergies as of 10/06/2018      Reactions   Codeine Other (See Comments)   Causes hiccups for at least one week      Medication List    TAKE these medications   allopurinol 300 MG tablet Commonly known as:  ZYLOPRIM Take 300 mg by mouth daily.   aspirin EC 81 MG tablet Take 81 mg by mouth daily.   atorvastatin 20 MG tablet Commonly known as:  LIPITOR Take 20 mg by mouth daily.   bumetanide 1 MG tablet Commonly known as:  BUMEX Take 1 mg by mouth 2 (two) times daily.   chlorpheniramine-HYDROcodone 10-8 MG/5ML Suer Commonly known as:  TUSSIONEX PENNKINETIC ER Take 5 mLs by mouth at bedtime as needed.   clopidogrel 75 MG tablet Commonly known as:  PLAVIX Take 75 mg by mouth daily.   donepezil 10 MG tablet Commonly known as:  ARICEPT   doxycycline 100 MG tablet Commonly known as:  VIBRA-TABS Take 1 tablet (100 mg total) by mouth 2 (two) times daily.   fluticasone 50 MCG/ACT nasal spray Commonly known as:  FLONASE   furosemide 40 MG  tablet Commonly known as:  LASIX Take 40 mg by mouth daily.   gabapentin 300 MG capsule Commonly known as:  NEURONTIN Take 300 mg by mouth 3 (three) times daily.   isosorbide mononitrate 30 MG 24 hr tablet Commonly known as:  IMDUR Take 30 mg by mouth daily.   levothyroxine 75 MCG tablet Commonly known as:  SYNTHROID, LEVOTHROID Take 75 mcg by mouth daily before breakfast.   metoprolol tartrate 50 MG tablet Commonly known as:  LOPRESSOR Take 50 mg by mouth daily.   NARCAN 4 MG/0.1ML Liqd nasal spray kit Generic drug:  naloxone Place 1 spray into the nose once.   nitroGLYCERIN 0.4 MG SL tablet Commonly known as:  NITROSTAT DISSOLVE ONE TABLET UNDER THE TONGUE EVERY 5 MINUTES AS NEEDED FOR CHEST PAIN. DO NOT EXCEED A TOTAL OF 3 DOSES IN 15 MINUTES GO TO ER IF Gary   PEG 3350 Powd Take 17 g by mouth daily.   potassium chloride 10 MEQ tablet Commonly known as:  K-DUR Take 10 mEq by mouth daily.   predniSONE 20 MG tablet Commonly known as:  DELTASONE Take 1 tablet (20 mg total) by mouth daily.   tamsulosin 0.4 MG Caps capsule Commonly known as:  FLOMAX Take 0.4 mg by mouth daily.   traMADol 50 MG tablet Commonly known as:  ULTRAM Take  50 mg by mouth every 8 (eight) hours as needed.        DISCHARGE INSTRUCTIONS:    Follow with PMD in 1-2 weeks.  If you experience worsening of your admission symptoms, develop shortness of breath, life threatening emergency, suicidal or homicidal thoughts you must seek medical attention immediately by calling 911 or calling your MD immediately  if symptoms less severe.  You Must read complete instructions/literature along with all the possible adverse reactions/side effects for all the Medicines you take and that have been prescribed to you. Take any new Medicines after you have completely understood and accept all the possible adverse reactions/side effects.   Please note  You were cared for by a hospitalist during your hospital  stay. If you have any questions about your discharge medications or the care you received while you were in the hospital after you are discharged, you can call the unit and asked to speak with the hospitalist on call if the hospitalist that took care of you is not available. Once you are discharged, your primary care physician will handle any further medical issues. Please note that Gary REFILLS for any discharge medications will be authorized once you are discharged, as it is imperative that you return to your primary care physician (or establish a relationship with a primary care physician if you do not have one) for your aftercare needs so that they can reassess your need for medications and monitor your lab values.    Today   CHIEF COMPLAINT:   Chief Complaint  Gary Moore presents with  . Chest Pain    HISTORY OF PRESENT ILLNESS:  Gary Moore  is a 63 y.o. male with a known history of  history of pretension, CAD, obesity, diastolic congestive heart failure presents to the emergency room due to chest pain relieved with nitro over the last 2 days.  He had similar pain 6 months back.  Which resolved. 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  His last PCI was 2 years back according to him in [place]. He is also felt extremely lethargic and tired over the last 2 days since the pain started.  He feels he is been retaining more fluid Presently he is chest pain-free.  Due to high risk and chest pain resolved with nitro Gary Moore is being admitted under observation for cardiology evaluation and further work-up.  The Gary Moore underwent bypass grafting at an outside facility. His ejection fraction was 55%. H~e had an 80 %left anterior descending stenosis, 80 % diagonal stenosis, 80 5 stenosis of an acute or obtuse marginal branch which came off either the circumflex or the right, and a significant stenosis in the P~DA which came off the left circumflex. The right coronary was occluded  and nondominant. H~e had an occluded 2nd obtuse marginal branch. Plan was for bypass of the left anterior descending, diagonal, acute marginal or obtuse marginal, and the P~DA~. Only a lima to the left anterior descending was placed. They could not harvestany veins. ~The surgery was evidently not completed. The Gary Moore had respiratory failure the next day. He was transported to [facility] and cared for on the [facility] service. H~e was eventually extubated. H~is ejection fraction was 55%. Marland Kitchen    VITAL SIGNS:  Blood pressure (!) 146/77, pulse 77, temperature 98 F (36.7 C), temperature source Oral, resp. rate 16, height '5\' 9"'$  (1.753 m), weight 115 kg, SpO2 97 %.  I/O:    Intake/Output Summary (Last 24 hours) at 10/06/2018 1504 Last  data filed at 10/06/2018 0838 Gross Moore 24 hour  Intake -  Output 300 ml  Net -300 ml    PHYSICAL EXAMINATION:  GENERAL:  63 y.o.-year-old Gary Moore lying in the bed with Gary acute distress.  EYES: Pupils equal, round, reactive to light and accommodation. Gary scleral icterus. Extraocular muscles intact.  HEENT: Head atraumatic, normocephalic. Oropharynx and nasopharynx clear.  NECK:  Supple, Gary jugular venous distention. Gary thyroid enlargement, Gary tenderness.  LUNGS: Normal breath sounds bilaterally, Gary wheezing, rales,rhonchi or crepitation. Gary use of accessory muscles of respiration.  CARDIOVASCULAR: S1, S2 normal. Gary murmurs, rubs, or gallops.  ABDOMEN: Soft, non-tender, non-distended. Bowel sounds present. Gary organomegaly or mass.  EXTREMITIES: Gary pedal edema, cyanosis, or clubbing.  NEUROLOGIC: Cranial nerves II through XII are intact. Muscle strength 5/5 in all extremities. Sensation intact. Gait not checked.  PSYCHIATRIC: The Gary Moore is alert and oriented x 3.  SKIN: Gary obvious rash, lesion, or ulcer.   DATA REVIEW:   CBC Recent Labs  Lab 10/06/18 0706  WBC 5.4  HGB 12.6*  HCT 40.0  PLT 234    Chemistries  Recent Labs  Lab 10/05/18 0958 10/06/18 0706   NA 139 139  K 3.8 3.9  CL 101 104  CO2 26 27  GLUCOSE 81 88  BUN 14 14  CREATININE 1.06 0.89  CALCIUM 8.7* 8.8*  AST 49*  --   ALT 34  --   ALKPHOS 105  --   BILITOT 0.6  --     Cardiac Enzymes Recent Labs  Lab 10/05/18 0958  TROPONINI <0.03    Microbiology Results  Gary results found for this or any previous visit.  RADIOLOGY:  Dg Chest 2 View  Result Date: 10/05/2018 CLINICAL DATA:  Chest pain. EXAM: CHEST - 2 VIEW COMPARISON:  None. FINDINGS: Mild cardiomegaly status post CABG. Normal mediastinal contours. Normal pulmonary vascularity. Linear atelectasis/scarring at the left lung base. Gary focal consolidation, pleural effusion, or pneumothorax. Gary acute osseous abnormality. IMPRESSION: Gary active cardiopulmonary disease. Electronically Signed   By: Titus Dubin M.D.   On: 10/05/2018 10:28   Nm Myocar Multi W/spect W/wall Motion / Ef  Result Date: 10/06/2018  Blood pressure demonstrated a blunted response to exercise.  There was Gary ST segment deviation noted during stress.  Gary T wave inversion was noted during stress.  The study is normal.  This is a low risk study.  The left ventricular ejection fraction is normal (55-65%).  Adequate chemical stress using Lexiscan Normal overall left ventricular function Normal wall motion Nondiagnostic EKG    EKG:   Orders placed or performed during the hospital encounter of 10/05/18  . EKG 12-Lead  . EKG 12-Lead  . ED EKG  . ED EKG      Management plans discussed with the Gary Moore, family and they are in agreement.  CODE STATUS:     Code Status Orders  (From admission, onward)         Start     Ordered   10/05/18 1216  Full code  Continuous     10/05/18 1218        Code Status History    This Gary Moore has a current code status but Gary historical code status.      TOTAL TIME TAKING CARE OF THIS Gary Moore: 35 minutes.    Vaughan Basta M.D on 10/06/2018 at 3:04 PM  Between 7am to 6pm - Pager -  [phone]  After 6pm go to [email or web address] - password [email or web address]  [facility]  Office  [phone]  CC: Primary care physician; Gary Moore, Gary Moore   Note: This dictation was prepared with Dragon dictation along with smaller phrase technology. Any transcriptional errors that result from this process are unintentional.

## 2018-10-06 NOTE — Discharge Instructions (Signed)
Follow with PMD in 1-2 weeks. °

## 2018-10-06 NOTE — ED Notes (Signed)
Signature pad not working. Pt signed paper copy that was placed in patients paper medical record.

## 2018-10-06 NOTE — Progress Notes (Signed)
Subjective:  Denies significant chest pain feels reasonably well lying comfortably in bed  Objective:  Vital Signs in the last 24 hours: Temp:  [98 F (36.7 C)] 98 F (36.7 C) (03/04 0802) Pulse Rate:  [56-77] 77 (03/04 1221) Resp:  [16-19] 16 (03/04 1221) BP: (100-146)/(59-92) 146/77 (03/04 1221) SpO2:  [91 %-100 %] 97 % (03/04 1221)  Intake/Output from previous day: No intake/output data recorded. Intake/Output from this shift: Total I/O In: -  Out: 300 [Urine:300]  Physical Exam: General appearance: appears stated age Neck: no adenopathy, no carotid bruit, no JVD, supple, symmetrical, trachea midline and thyroid not enlarged, symmetric, no tenderness/mass/nodules Lungs: clear to auscultation bilaterally Heart: regular rate and rhythm, S1, S2 normal, no murmur, click, rub or gallop Abdomen: soft, non-tender; bowel sounds normal; no masses,  no organomegaly Extremities: extremities normal, atraumatic, no cyanosis or edema Pulses: 2+ and symmetric Skin: Skin color, texture, turgor normal. No rashes or lesions Neurologic: Alert and oriented X 3, normal strength and tone. Normal symmetric reflexes. Normal coordination and gait  Lab Results: Recent Labs    10/05/18 0958 10/06/18 0706  WBC 8.3 5.4  HGB 13.7 12.6*  PLT 251 234   Recent Labs    10/05/18 0958 10/06/18 0706  NA 139 139  K 3.8 3.9  CL 101 104  CO2 26 27  GLUCOSE 81 88  BUN 14 14  CREATININE 1.06 0.89   Recent Labs    10/05/18 0958  TROPONINI <0.03   Hepatic Function Panel Recent Labs    10/05/18 0958  PROT 7.0  ALBUMIN 4.0  AST 49*  ALT 34  ALKPHOS 105  BILITOT 0.6  BILIDIR 0.2  IBILI 0.4   No results for input(s): CHOL in the last 72 hours. No results for input(s): PROTIME in the last 72 hours.  Imaging: Imaging results have been reviewed  Cardiac Studies:  Assessment/Plan:  Angina CABG Chest Pain CHF Coronary Artery Disease Coronary Artery Stent Ischemic Heart  Disease Shortness of Breath  . Plan Rule out for myocardial infarction with flat enzymes Agree with conservative medical therapy Proceed with functional study Lexiscan Myoview Continue conservative medical therapy If stress test negative with the patient follow-up as an outpatient with cardiology  LOS: 0 days     D  10/06/2018, 2:56 PM

## 2018-10-06 NOTE — ED Notes (Signed)
Pt denies CP/SOB/Dizziness. Pt is A/ox4. Urinal provided to pt. NAD noted at this time. This RN will continue to monitor pt.

## 2018-10-06 NOTE — ED Notes (Signed)
ED TO INPATIENT HANDOFF REPORT  ED Nurse Name and Phone #: Demetrio Lapping Janda Cargo, [phone]  S Name/Age/Gender Gary Moore 63 y.o. male Room/Bed: ED35A/ED35A  Code Status   Code Status: Full Code  Home/SNF/Other Home {Patient oriented to: self, place, time , situation Is this baseline? Yes   Triage Complete: Triage complete  Chief Complaint chest discomfort ems  Triage Note PT arrived to Ed via [facility] from [facility] urgent Care . Per EMS pt c/o CP, feeling lethargic, "tired", pt denies SOB and headache, 3 aspirins and nitro  given at urgent care. Per EMS; bp 130/70; HR 72; SpO2 97%;RR20. NAD noted at this time.    Allergies Allergies  Allergen Reactions  . Codeine Other (See Comments)    Causes hiccups for at least one week    Level of Care/Admitting Diagnosis ED Disposition    ED Disposition Condition Comment   Admit  Hospital Area: [facility] [[id number]]  Level of Care: Telemetry [5]  Diagnosis: Angina at rest [facility]) [[id number]]  Admitting Physician: Milagros Loll [[id number]]  Attending Physician: Milagros Loll [[id number]]  PT Class (Do Not Modify): Observation [104]  PT Acc Code (Do Not Modify): Observation [10022]       B Medical/Surgery History Past Medical History:  Diagnosis Date  . Hypertension   . Thyroid disease    Past Surgical History:  Procedure Laterality Date  . CARDIAC SURGERY    . GASTRIC BYPASS    . REPLACEMENT TOTAL KNEE BILATERAL       A IV Location/Drains/Wounds Patient Lines/Drains/Airways Status   Active Line/Drains/Airways    Name:   Placement date:   Placement time:   Site:   Days:   Peripheral IV 10/05/18 Left Forearm   10/05/18    0918    Forearm   1          Intake/Output Last 24 hours No intake or output data in the 24 hours ending 10/06/18 0034  Labs/Imaging Results for orders placed or performed during the hospital encounter of 10/05/18 (from the past 48 hour(s))  Basic metabolic panel     Status: Abnormal    Collection Time: 10/05/18  9:58 AM  Result Value Ref Range   Sodium 139 135 - 145 mmol/L   Potassium 3.8 3.5 - 5.1 mmol/L   Chloride 101 98 - 111 mmol/L   CO2 26 22 - 32 mmol/L   Glucose, Bld 81 70 - 99 mg/dL   BUN 14 8 - 23 mg/dL   Creatinine, Ser 1.30 0.61 - 1.24 mg/dL   Calcium 8.7 (L) 8.9 - 10.3 mg/dL   GFR calc non Af Amer >60 >60 mL/min   GFR calc Af Amer >60 >60 mL/min   Anion gap 12 5 - 15    Comment: Performed at [facility], [address] [place], [place] [address]  CBC     Status: None   Collection Time: 10/05/18  9:58 AM  Result Value Ref Range   WBC 8.3 4.0 - 10.5 K/uL   RBC 4.65 4.22 - 5.81 MIL/uL   Hemoglobin 13.7 13.0 - 17.0 g/dL   HCT 46.9 62.9 - 52.8 %   MCV 91.4 80.0 - 100.0 fL   MCH 29.5 26.0 - 34.0 pg   MCHC 32.2 30.0 - 36.0 g/dL   RDW 41.3 24.4 - 01.0 %   Platelets 251 150 - 400 K/uL   nRBC 0.0 0.0 - 0.2 %    Comment: Performed at [facility], [address] [place], [place] [address]  Troponin I - ONCE - STAT     Status: None   Collection Time: 10/05/18  9:58 AM  Result Value Ref Range   Troponin I <0.03 <0.03 ng/mL    Comment: Performed at [facility], [address] [place], [place] [address]  Hepatic function panel     Status: Abnormal   Collection Time: 10/05/18  9:58 AM  Result Value Ref Range   Total Protein 7.0 6.5 - 8.1 g/dL   Albumin 4.0 3.5 - 5.0 g/dL   AST 49 (H) 15 - 41 U/L   ALT 34 0 - 44 U/L   Alkaline Phosphatase 105 38 - 126 U/L   Total Bilirubin 0.6 0.3 - 1.2 mg/dL   Bilirubin, Direct 0.2 0.0 - 0.2 mg/dL   Indirect Bilirubin 0.4 0.3 - 0.9 mg/dL    Comment: Performed at [facility], [address] [place], [place] [address]  Lipase, blood     Status: None   Collection Time: 10/05/18  9:58 AM  Result Value Ref Range   Lipase 35 11 - 51 U/L    Comment: Performed at [facility], [address] [place], [place] [address]  Brain natriuretic peptide     Status: Abnormal    Collection Time: 10/05/18 10:05 AM  Result Value Ref Range   B Natriuretic Peptide 108.0 (H) 0.0 - 100.0 pg/mL    Comment: Performed at [facility], [address] [place], [place] [address]  Protime-INR     Status: None   Collection Time: 10/05/18 11:17 AM  Result Value Ref Range   Prothrombin Time 13.0 11.4 - 15.2 seconds   INR 1.0 0.8 - 1.2    Comment: (NOTE) INR goal varies based on device and disease states. Performed at [facility], [address] [place], [place] [address]   APTT     Status: None   Collection Time: 10/05/18 11:17 AM  Result Value Ref Range   aPTT 29 24 - 36 seconds    Comment: Performed at [facility], [address] [place], [place] [address]   Dg Chest 2 View  Result Date: 10/05/2018 CLINICAL DATA:  Chest pain. EXAM: CHEST - 2 VIEW COMPARISON:  None. FINDINGS: Mild cardiomegaly status post CABG. Normal mediastinal contours. Normal pulmonary vascularity. Linear atelectasis/scarring at the left lung base. No focal consolidation, pleural effusion, or pneumothorax. No acute osseous abnormality. IMPRESSION: No active cardiopulmonary disease. Electronically Signed   By: Obie Dredge M.D.   On: 10/05/2018 10:28    Pending Labs Unresulted Labs (From admission, onward)    Start     Ordered   10/12/18 0500  Creatinine, serum  (enoxaparin (LOVENOX)    CrCl >/= 30 ml/min)  Weekly,   STAT    Comments:  while on enoxaparin therapy    10/05/18 1218   10/06/18 0500  Basic metabolic panel  Tomorrow morning,   STAT     10/05/18 1218   10/06/18 0500  CBC  Tomorrow morning,   STAT     10/05/18 1218   10/05/18 1216  HIV antibody (Routine Testing)  Add-on,   AD     10/05/18 1218          Vitals/Pain Today's Vitals   10/05/18 1754 10/05/18 1810 10/05/18 1900 10/05/18 1915  BP:  (!) 142/84 (!) 132/92   Pulse:      Resp:  18 17   Temp:  TempSrc:      SpO2:      Weight:      Height:      PainSc: 0-No pain   0-No pain     Isolation Precautions No active isolations  Medications Medications  enoxaparin (LOVENOX) injection 40 mg (40 mg Subcutaneous Given 10/05/18 2257)  sodium chloride flush (NS) 0.9 % injection 3 mL (0 mLs Intravenous Hold 10/05/18 2233)  acetaminophen (TYLENOL) tablet 650 mg (has no administration in time range)    Or  acetaminophen (TYLENOL) suppository 650 mg (has no administration in time range)  polyethylene glycol (MIRALAX / GLYCOLAX) packet 17 g (has no administration in time range)  ondansetron (ZOFRAN) tablet 4 mg (has no administration in time range)    Or  ondansetron (ZOFRAN) injection 4 mg (has no administration in time range)  albuterol (PROVENTIL) (2.5 MG/3ML) 0.083% nebulizer solution 2.5 mg (has no administration in time range)  aspirin EC tablet 81 mg (has no administration in time range)  allopurinol (ZYLOPRIM) tablet 300 mg (300 mg Oral Given 10/05/18 1910)  traMADol (ULTRAM) tablet 50 mg (has no administration in time range)  atorvastatin (LIPITOR) tablet 20 mg (20 mg Oral Given 10/05/18 1812)  bumetanide (BUMEX) tablet 1 mg (1 mg Oral Refused 10/05/18 2257)  isosorbide mononitrate (IMDUR) 24 hr tablet 30 mg (30 mg Oral Given 10/05/18 1811)  metoprolol tartrate (LOPRESSOR) tablet 50 mg (50 mg Oral Given 10/05/18 1812)  nitroGLYCERIN (NITROSTAT) SL tablet 0.4 mg (has no administration in time range)  donepezil (ARICEPT) tablet 10 mg (10 mg Oral Given 10/05/18 2258)  levothyroxine (SYNTHROID, LEVOTHROID) tablet 75 mcg (has no administration in time range)  tamsulosin (FLOMAX) capsule 0.4 mg (0.4 mg Oral Given 10/05/18 1811)  clopidogrel (PLAVIX) tablet 75 mg (75 mg Oral Given 10/05/18 1810)  gabapentin (NEURONTIN) capsule 300 mg (300 mg Oral Given 10/05/18 2257)  potassium chloride (K-DUR,KLOR-CON) CR tablet 10 mEq (10 mEq Oral Given 10/05/18 1910)  polyethylene glycol (MIRALAX / GLYCOLAX) packet 17 g (17 g Oral Given 10/05/18 1816)  zolpidem (AMBIEN) tablet 10 mg (10 mg Oral Given 10/05/18  2319)    Mobility walks Low fall risk   Focused Assessments Cardiac Assessment Handoff:  Cardiac Rhythm: Normal sinus rhythm(at 76bpm) Lab Results  Component Value Date   TROPONINI <0.03 10/05/2018   No results found for: DDIMER Does the Patient currently have chest pain? No     R Recommendations: See Admitting Provider Note  Report given to:   Additional Notes:

## 2018-10-07 LAB — ECHOCARDIOGRAM COMPLETE
Height: 69 in
WEIGHTICAEL: 4056.46 [oz_av]

## 2019-12-26 IMAGING — CR DG CHEST 2V
2 series · 2 of 2 positions shown · non-contrast
Comparison: None.

CLINICAL DATA: Chest pain.

EXAM:
CHEST - 2 VIEW

[chest pa]
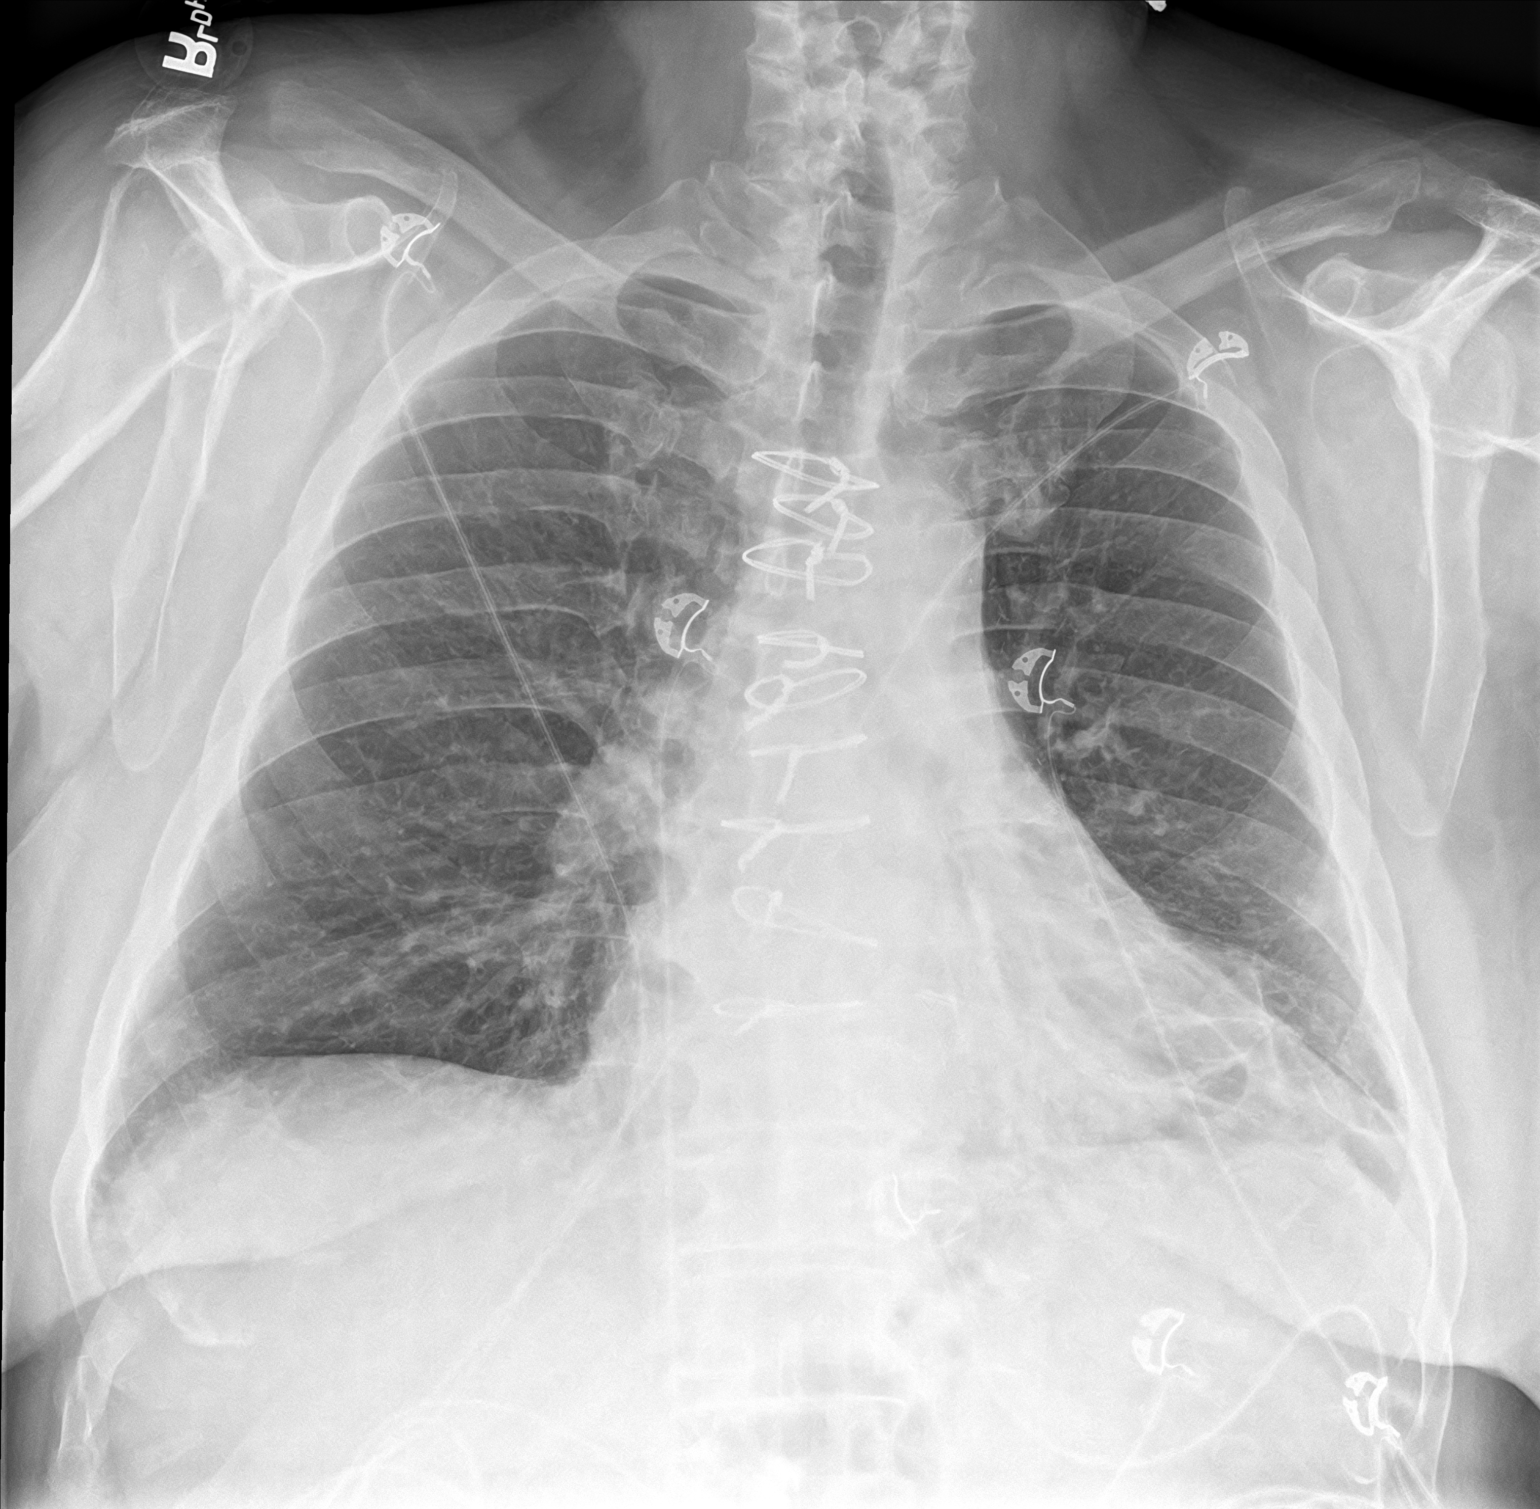

[chest lat]
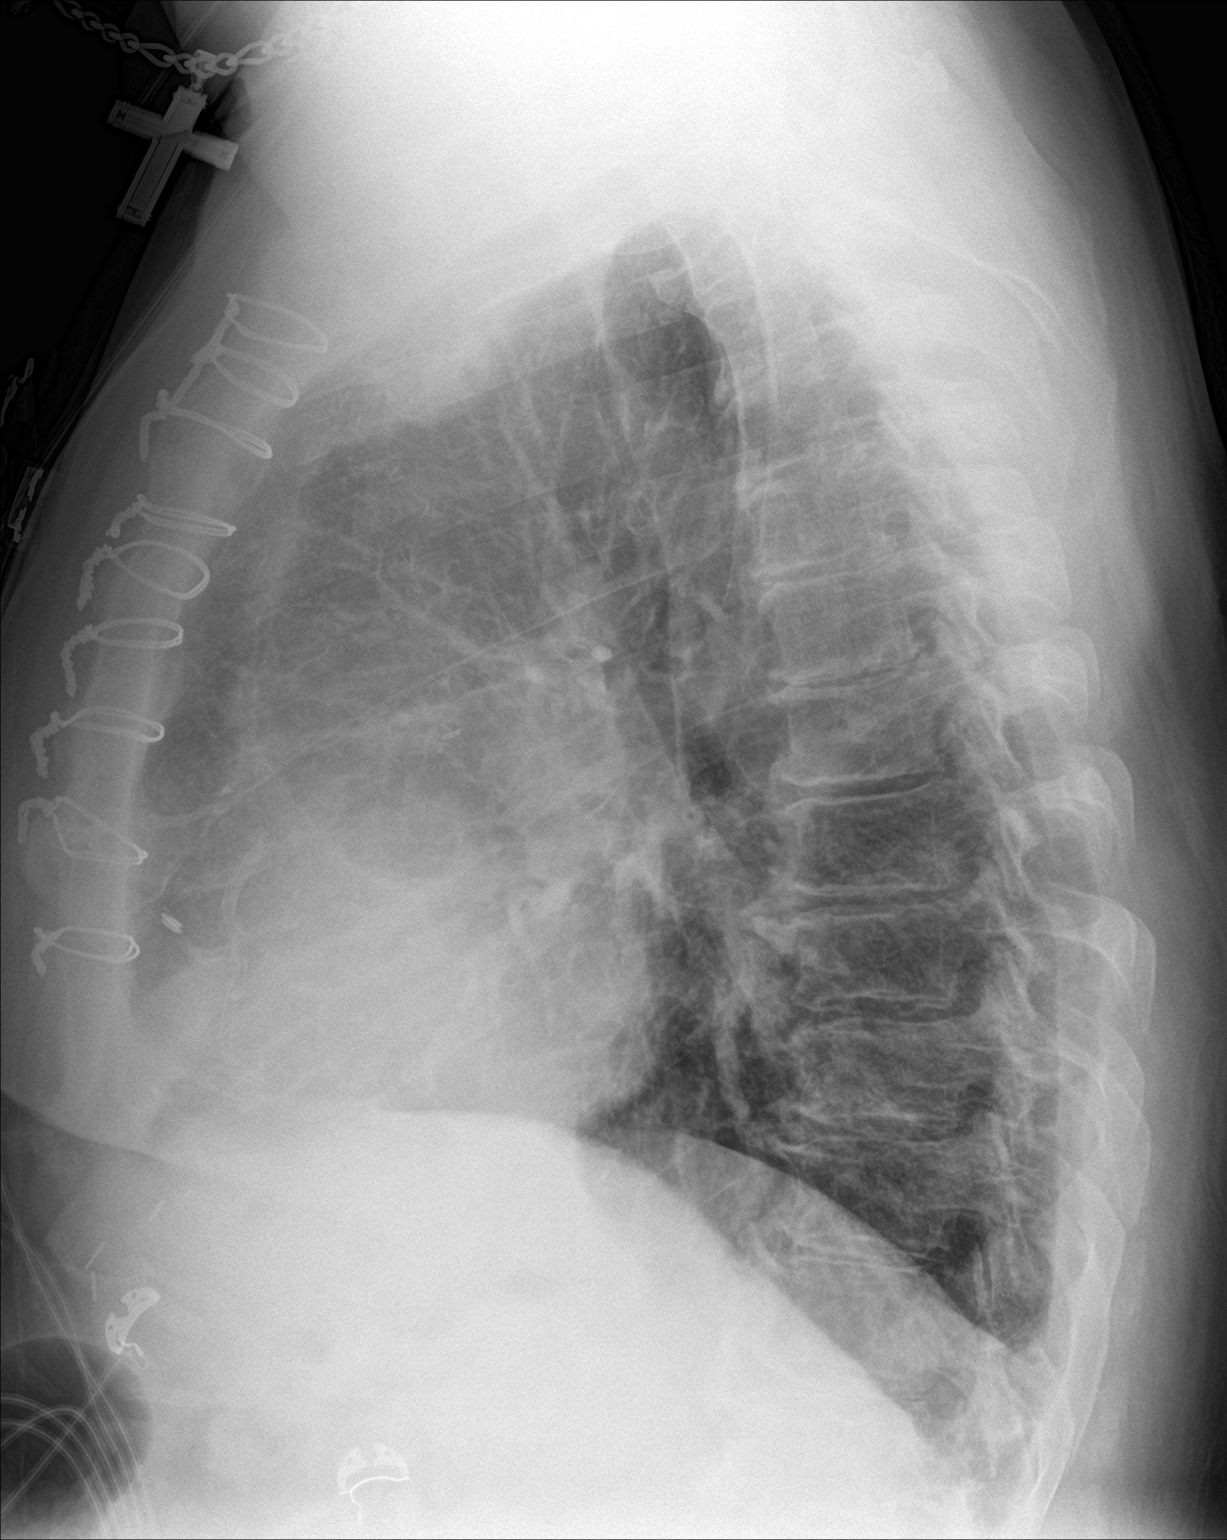

[2 of 2 positions shown; findings below may reference images not displayed]

FINDINGS: Mild cardiomegaly status post CABG. Normal mediastinal contours.
Normal pulmonary vascularity. Linear atelectasis/scarring at the
left lung base. No focal consolidation, pleural effusion, or
pneumothorax. No acute osseous abnormality.
IMPRESSION: No active cardiopulmonary disease.

## 2024-07-04 DEATH — deceased
